# Patient Record
Sex: Female | Born: 1970 | Race: White | Hispanic: No | Smoking: Never smoker
Health system: Southern US, Community
[De-identification: ages and names within clinical notes are randomized; demographics above are authoritative.]

## PROBLEM LIST (undated history)

## (undated) DIAGNOSIS — I480 Paroxysmal atrial fibrillation: Secondary | ICD-10-CM

## (undated) DIAGNOSIS — I10 Essential (primary) hypertension: Secondary | ICD-10-CM

## (undated) DIAGNOSIS — R011 Cardiac murmur, unspecified: Secondary | ICD-10-CM

## (undated) DIAGNOSIS — E669 Obesity, unspecified: Secondary | ICD-10-CM

## (undated) DIAGNOSIS — N950 Postmenopausal bleeding: Secondary | ICD-10-CM

## (undated) DIAGNOSIS — M1990 Unspecified osteoarthritis, unspecified site: Secondary | ICD-10-CM

## (undated) DIAGNOSIS — K5190 Ulcerative colitis, unspecified, without complications: Secondary | ICD-10-CM

## (undated) DIAGNOSIS — R7303 Prediabetes: Secondary | ICD-10-CM

## (undated) DIAGNOSIS — N83201 Unspecified ovarian cyst, right side: Secondary | ICD-10-CM

## (undated) DIAGNOSIS — R7309 Other abnormal glucose: Secondary | ICD-10-CM

## (undated) DIAGNOSIS — D649 Anemia, unspecified: Secondary | ICD-10-CM

## (undated) DIAGNOSIS — I4891 Unspecified atrial fibrillation: Secondary | ICD-10-CM

## (undated) DIAGNOSIS — K219 Gastro-esophageal reflux disease without esophagitis: Secondary | ICD-10-CM

## (undated) DIAGNOSIS — F419 Anxiety disorder, unspecified: Secondary | ICD-10-CM

## (undated) DIAGNOSIS — J189 Pneumonia, unspecified organism: Secondary | ICD-10-CM

## (undated) HISTORY — DX: Essential (primary) hypertension: I10

## (undated) HISTORY — DX: Other abnormal glucose: R73.09

## (undated) HISTORY — DX: Anemia, unspecified: D64.9

## (undated) HISTORY — DX: Cardiac murmur, unspecified: R01.1

## (undated) HISTORY — PX: DILATION AND CURETTAGE OF UTERUS: SHX78

## (undated) HISTORY — DX: Ulcerative colitis, unspecified, without complications: K51.90

## (undated) HISTORY — DX: Obesity, unspecified: E66.9

## (undated) HISTORY — PX: OTHER SURGICAL HISTORY: SHX169

---

## 1993-05-19 HISTORY — PX: TUBAL LIGATION: SHX77

## 1999-09-18 HISTORY — PX: SIGMOIDOSCOPY: SUR1295

## 2012-04-07 ENCOUNTER — Emergency Department: Payer: Self-pay | Admitting: Emergency Medicine

## 2012-04-07 LAB — CBC
HCT: 37.3 % (ref 35.0–47.0)
MCH: 26 pg (ref 26.0–34.0)
MCHC: 32.6 g/dL (ref 32.0–36.0)
MCV: 80 fL (ref 80–100)
Platelet: 333 10*3/uL (ref 150–440)
RDW: 14.7 % — ABNORMAL HIGH (ref 11.5–14.5)
WBC: 10.9 10*3/uL (ref 3.6–11.0)

## 2012-04-07 LAB — BASIC METABOLIC PANEL
BUN: 11 mg/dL (ref 7–18)
Calcium, Total: 9.6 mg/dL (ref 8.5–10.1)
Creatinine: 0.83 mg/dL (ref 0.60–1.30)
EGFR (African American): >60
EGFR (Non-African Amer.): >60
Glucose: 96 mg/dL (ref 65–99)
Potassium: 3.7 mmol/L (ref 3.5–5.1)
Sodium: 135 mmol/L — ABNORMAL LOW (ref 136–145)

## 2012-04-07 LAB — CK TOTAL AND CKMB (NOT AT ARMC)
CK, Total: 55 U/L (ref 21–215)
CK-MB: <0.5 ng/mL — ABNORMAL LOW (ref 0.5–3.6)

## 2012-09-23 ENCOUNTER — Ambulatory Visit: Payer: Self-pay | Admitting: Unknown Physician Specialty

## 2012-09-23 HISTORY — PX: COLONOSCOPY: SHX174

## 2012-09-23 LAB — CLOSTRIDIUM DIFFICILE BY PCR

## 2012-09-27 LAB — PATHOLOGY REPORT

## 2013-02-02 ENCOUNTER — Encounter: Payer: Self-pay | Admitting: *Deleted

## 2013-02-03 ENCOUNTER — Encounter: Payer: Self-pay | Admitting: Cardiovascular Disease

## 2013-02-03 ENCOUNTER — Ambulatory Visit (INDEPENDENT_AMBULATORY_CARE_PROVIDER_SITE_OTHER): Payer: 59 | Admitting: Cardiovascular Disease

## 2013-02-03 VITALS — BP 145/89 | HR 83 | Ht 65.0 in | Wt 227.5 lb

## 2013-02-03 DIAGNOSIS — I10 Essential (primary) hypertension: Secondary | ICD-10-CM

## 2013-02-03 DIAGNOSIS — R011 Cardiac murmur, unspecified: Secondary | ICD-10-CM | POA: Insufficient documentation

## 2013-02-03 NOTE — Progress Notes (Signed)
Primary care physician: Dr. Nira Conn  HPI  This is a pleasant 42 year old female who was referred for evaluation of a cardiac murmur. The patient is not aware of any previous cardiac history. There is no history of rheumatic fever or congenital heart disease. She has chronic medical conditions that include hypertension, ulcerative colitis,  prediabetes and obesity. She was diagnosed with hypertension about a year ago and was started on medications over the last 6 months. Blood pressure is usually much higher at the physician's office. There is no history of sleep apnea and does not have symptoms suggestive of this. She is no longer using NSAIDs. There is family history of hypertension but not at a young age.  She was told of a cardiac murmur recently. She denies any chest pain, dyspnea or any other cardiac symptoms.  Allergies  Allergen Reactions  . Hydrochlorothiazide      Current Outpatient Prescriptions on File Prior to Visit  Medication Sig Dispense Refill  . amLODipine (NORVASC) 5 MG tablet Take 5 mg by mouth daily.      Marland Kitchen lisinopril (PRINIVIL,ZESTRIL) 40 MG tablet Take 40 mg by mouth daily.      . mesalamine (APRISO) 0.375 G 24 hr capsule Take 375 mg by mouth 2 (two) times daily.       No current facility-administered medications on file prior to visit.     Past Medical History  Diagnosis Date  . Obesity, unspecified   . Other abnormal glucose   . Ulcerative colitis, unspecified   . Heart murmur   . Unspecified essential hypertension      Past Surgical History  Procedure Laterality Date  . Tubal ligation    . Dilation and curettage of uterus       Family History  Problem Relation Age of Onset  . Hypertension Father      History   Social History  . Marital Status: Married    Spouse Name: N/A    Number of Children: N/A  . Years of Education: N/A   Occupational History  . Not on file.   Social History Main Topics  . Smoking status: Never Smoker     . Smokeless tobacco: Not on file  . Alcohol Use: No  . Drug Use: No  . Sexual Activity: Not on file   Other Topics Concern  . Not on file   Social History Narrative  . No narrative on file     ROS A 10 point review of system was performed. It's negative other than what is mentioned in the history of present illness.  PHYSICAL EXAM   BP 145/89  Pulse 83  Ht 5\' 5"  (1.651 m)  Wt 227 lb 8 oz (103.193 kg)  BMI 37.86 kg/m2 Constitutional: She is oriented to person, place, and time. She appears well-developed and well-nourished. No distress.  HENT: No nasal discharge.  Head: Normocephalic and atraumatic.  Eyes: Pupils are equal and round. Right eye exhibits no discharge. Left eye exhibits no discharge.  Neck: Normal range of motion. Neck supple. No JVD present. No thyromegaly present.  Cardiovascular: Normal rate, regular rhythm, normal heart sounds. Exam reveals no gallop and no friction rub. There is a 1/6 systolic ejection murmur at the base. Pulmonary/Chest: Effort normal and breath sounds normal. No stridor. No respiratory distress. She has no wheezes. She has no rales. She exhibits no tenderness.  Abdominal: Soft. Bowel sounds are normal. She exhibits no distension. There is no tenderness. There is no rebound and  no guarding.  Musculoskeletal: Normal range of motion. She exhibits no edema and no tenderness.  Neurological: She is alert and oriented to person, place, and time. Coordination normal.  Skin: Skin is warm and dry. No rash noted. She is not diaphoretic. No erythema. No pallor.  Psychiatric: She has a normal mood and affect. Her behavior is normal. Judgment and thought content normal.     JXB:JYNWG  Rhythm  WITHIN NORMAL LIMITS   ASSESSMENT AND PLAN

## 2013-02-03 NOTE — Patient Instructions (Addendum)
Your physician has requested that you have an echocardiogram. Echocardiography is a painless test that uses sound waves to create images of your heart. It provides your doctor with information about the size and shape of your heart and how well your heart's chambers and valves are working. This procedure takes approximately one hour. There are no restrictions for this procedure.  Your physician has requested that you have a renal artery duplex. During this test, an ultrasound is used to evaluate blood flow to the kidneys. Allow one hour for this exam. Do not eat after midnight the day before and avoid carbonated beverages. Take your medications as you usually do.   Follow up as needed.

## 2013-02-03 NOTE — Assessment & Plan Note (Signed)
Most likely this is a functional murmur. However, given her history of hypertension, I will obtain an echocardiogram to evaluate this.

## 2013-02-03 NOTE — Assessment & Plan Note (Signed)
It is somewhat unusual to have this degree of hypertension and her age group without strong family history of hypertension. Thus, I think it's reasonable to evaluate her for secondary hypertension. I don't have her previous labs available for review. It is reasonable to check Aldosterone/Renin ratio and thyroid function if not already done. She has no symptoms suggestive of sleep apnea. I will obtain renal artery duplex ultrasound to evaluate for possible fibromuscular dysplasia.

## 2013-02-18 ENCOUNTER — Other Ambulatory Visit: Payer: Self-pay

## 2013-02-18 ENCOUNTER — Other Ambulatory Visit (INDEPENDENT_AMBULATORY_CARE_PROVIDER_SITE_OTHER): Payer: 59

## 2013-02-18 DIAGNOSIS — I369 Nonrheumatic tricuspid valve disorder, unspecified: Secondary | ICD-10-CM

## 2013-02-18 DIAGNOSIS — R011 Cardiac murmur, unspecified: Secondary | ICD-10-CM

## 2013-02-25 ENCOUNTER — Telehealth: Payer: Self-pay | Admitting: *Deleted

## 2013-02-25 NOTE — Telephone Encounter (Signed)
Message copied by Marilynne Halsted on Fri Feb 25, 2013  3:20 PM ------      Message from: Lorine Bears A      Created: Fri Feb 25, 2013  3:04 PM       Inform patient that echo was fine. Normal heart function. 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       CC: Dr. Lahoma Rocker ------

## 2013-02-25 NOTE — Telephone Encounter (Signed)
Patient called wanting echo results. Thanks

## 2013-02-25 NOTE — Telephone Encounter (Signed)
Spoke w/ pt.  She is aware of results. Will call again if any questions or concerns.

## 2013-03-24 ENCOUNTER — Encounter (INDEPENDENT_AMBULATORY_CARE_PROVIDER_SITE_OTHER): Payer: 59

## 2013-03-24 DIAGNOSIS — I10 Essential (primary) hypertension: Secondary | ICD-10-CM

## 2013-03-28 ENCOUNTER — Telehealth: Payer: Self-pay

## 2013-03-28 NOTE — Telephone Encounter (Signed)
Message copied by Marilynne Halsted on Mon Mar 28, 2013  5:27 PM ------      Message from: Lorine Bears A      Created: Sun Mar 27, 2013 12:44 PM       No evidence of renal artery stenosis. ------

## 2013-03-28 NOTE — Telephone Encounter (Signed)
Spoke w/ pt.  She is aware of results.  

## 2014-01-04 ENCOUNTER — Emergency Department: Payer: Self-pay | Admitting: Internal Medicine

## 2014-01-04 LAB — CBC
HCT: 38.5 % (ref 35.0–47.0)
HGB: 12.8 g/dL (ref 12.0–16.0)
MCH: 27.3 pg (ref 26.0–34.0)
MCHC: 33.2 g/dL (ref 32.0–36.0)
MCV: 82 fL (ref 80–100)
PLATELETS: 325 10*3/uL (ref 150–440)
RBC: 4.7 10*6/uL (ref 3.80–5.20)
RDW: 14.6 % — AB (ref 11.5–14.5)
WBC: 7 10*3/uL (ref 3.6–11.0)

## 2014-01-04 LAB — BASIC METABOLIC PANEL
Anion Gap: 8 (ref 7–16)
BUN: 10 mg/dL (ref 7–18)
CALCIUM: 9 mg/dL (ref 8.5–10.1)
CO2: 27 mmol/L (ref 21–32)
Chloride: 105 mmol/L (ref 98–107)
Creatinine: 0.93 mg/dL (ref 0.60–1.30)
Glucose: 90 mg/dL (ref 65–99)
OSMOLALITY: 278 (ref 275–301)
Potassium: 3.9 mmol/L (ref 3.5–5.1)
Sodium: 140 mmol/L (ref 136–145)

## 2014-01-04 LAB — TROPONIN I

## 2014-01-04 LAB — D-DIMER(ARMC): D-DIMER: 318 ng/mL

## 2014-01-04 LAB — TSH: Thyroid Stimulating Horm: 1.12 u[IU]/mL

## 2014-01-05 ENCOUNTER — Telehealth: Payer: Self-pay

## 2014-01-05 DIAGNOSIS — Z01812 Encounter for preprocedural laboratory examination: Secondary | ICD-10-CM

## 2014-01-05 DIAGNOSIS — R0789 Other chest pain: Secondary | ICD-10-CM

## 2014-01-05 DIAGNOSIS — I739 Peripheral vascular disease, unspecified: Secondary | ICD-10-CM

## 2014-01-05 NOTE — Telephone Encounter (Signed)
Pt called, states she was in ED for chest heaviness, states that everything checked out ok, but not sure if Dr. Kirke CorinArida wanted to see her. Please call and advise

## 2014-01-05 NOTE — Telephone Encounter (Signed)
Patient was in the ED for chest heaviness  She stated the ED doc wanted her to ask about a stress test  She was sent home the dame day with an RX for Ativan  She wants to know if Dr. Kirke CorinArida wants to see her

## 2014-01-05 NOTE — Telephone Encounter (Signed)
Schedule a treadmill stress test and follow up.

## 2014-01-06 NOTE — Telephone Encounter (Signed)
Just to clarify  You want GXT not Myoview correct?

## 2014-01-06 NOTE — Telephone Encounter (Signed)
Yes, GXT.

## 2014-01-09 ENCOUNTER — Encounter: Payer: Self-pay | Admitting: *Deleted

## 2014-01-09 NOTE — Telephone Encounter (Signed)
Patient scheduled for GXT  Patient aware

## 2014-01-09 NOTE — Telephone Encounter (Signed)
LVM 8/24 

## 2014-01-26 ENCOUNTER — Ambulatory Visit (INDEPENDENT_AMBULATORY_CARE_PROVIDER_SITE_OTHER): Payer: 59 | Admitting: Cardiovascular Disease

## 2014-01-26 ENCOUNTER — Encounter: Payer: Self-pay | Admitting: Cardiovascular Disease

## 2014-01-26 VITALS — BP 125/60 | HR 88 | Ht 65.0 in | Wt 229.8 lb

## 2014-01-26 DIAGNOSIS — R0789 Other chest pain: Secondary | ICD-10-CM

## 2014-01-26 NOTE — Patient Instructions (Signed)
Normal stress test

## 2014-01-26 NOTE — Procedures (Signed)
   Treadmill Stress test  Indication: Chest pain  Baseline Data:  Resting EKG shows NSR with rate of 88 bpm, no significant ST or T wave changes. Resting blood pressure of 125/66 mm Hg Stand bruce protocal was used.  Exercise Data:  Patient exercised for 6 min 0 sec,  Peak heart rate of 153 bpm.  This was 87 % of the maximum predicted heart rate. No symptoms of chest pain or lightheadedness were reported at peak stress or in recovery.  Peak Blood pressure recorded was 164/69 Maximal work level: 7 METs.  Heart rate at 3 minutes in recovery was 110 bpm. BP response: Normal HR response: Normal  EKG with Exercise: Sinus tachycardia with no significant ST changes.  FINAL IMPRESSION: Normal exercise stress test. No significant EKG changes concerning for ischemia. Average exercise tolerance.  Recommendation: The chest pain is likely noncardiac and could be related to anxiety.

## 2014-01-31 ENCOUNTER — Other Ambulatory Visit: Payer: Self-pay | Admitting: *Deleted

## 2015-07-20 ENCOUNTER — Other Ambulatory Visit: Payer: Self-pay | Admitting: Internal Medicine

## 2015-07-20 DIAGNOSIS — R102 Pelvic and perineal pain: Secondary | ICD-10-CM

## 2015-07-23 ENCOUNTER — Other Ambulatory Visit: Payer: Self-pay | Admitting: Internal Medicine

## 2015-07-23 DIAGNOSIS — R102 Pelvic and perineal pain: Secondary | ICD-10-CM

## 2015-07-30 ENCOUNTER — Ambulatory Visit
Admission: RE | Admit: 2015-07-30 | Discharge: 2015-07-30 | Disposition: A | Discharge status: Home / Self Care | Payer: Self-pay | Source: Ambulatory Visit | Attending: Internal Medicine | Admitting: Internal Medicine

## 2015-08-06 ENCOUNTER — Ambulatory Visit
Admission: RE | Admit: 2015-08-06 | Discharge: 2015-08-06 | Disposition: A | Discharge status: Home / Self Care | Payer: 59 | Source: Ambulatory Visit | Attending: Internal Medicine | Admitting: Internal Medicine

## 2015-08-06 DIAGNOSIS — R102 Pelvic and perineal pain: Secondary | ICD-10-CM | POA: Diagnosis not present

## 2015-08-06 DIAGNOSIS — N858 Other specified noninflammatory disorders of uterus: Secondary | ICD-10-CM | POA: Insufficient documentation

## 2016-09-01 DIAGNOSIS — I10 Essential (primary) hypertension: Secondary | ICD-10-CM | POA: Diagnosis not present

## 2016-09-01 DIAGNOSIS — M7661 Achilles tendinitis, right leg: Secondary | ICD-10-CM | POA: Diagnosis not present

## 2016-09-01 DIAGNOSIS — J3089 Other allergic rhinitis: Secondary | ICD-10-CM | POA: Diagnosis not present

## 2017-02-13 DIAGNOSIS — M25562 Pain in left knee: Secondary | ICD-10-CM | POA: Diagnosis not present

## 2017-02-13 DIAGNOSIS — I10 Essential (primary) hypertension: Secondary | ICD-10-CM | POA: Diagnosis not present

## 2017-02-24 DIAGNOSIS — I10 Essential (primary) hypertension: Secondary | ICD-10-CM | POA: Diagnosis not present

## 2017-03-11 DIAGNOSIS — Z0000 Encounter for general adult medical examination without abnormal findings: Secondary | ICD-10-CM | POA: Diagnosis not present

## 2017-03-11 DIAGNOSIS — R7303 Prediabetes: Secondary | ICD-10-CM | POA: Diagnosis not present

## 2017-03-27 DIAGNOSIS — J208 Acute bronchitis due to other specified organisms: Secondary | ICD-10-CM | POA: Diagnosis not present

## 2017-03-27 DIAGNOSIS — B9689 Other specified bacterial agents as the cause of diseases classified elsewhere: Secondary | ICD-10-CM | POA: Diagnosis not present

## 2018-07-14 ENCOUNTER — Other Ambulatory Visit: Payer: Self-pay | Admitting: Internal Medicine

## 2018-07-14 DIAGNOSIS — M545 Low back pain: Secondary | ICD-10-CM | POA: Diagnosis not present

## 2018-07-14 DIAGNOSIS — Z23 Encounter for immunization: Secondary | ICD-10-CM | POA: Diagnosis not present

## 2018-07-14 DIAGNOSIS — Z0000 Encounter for general adult medical examination without abnormal findings: Secondary | ICD-10-CM | POA: Diagnosis not present

## 2018-07-14 DIAGNOSIS — M5416 Radiculopathy, lumbar region: Secondary | ICD-10-CM | POA: Diagnosis not present

## 2018-07-14 DIAGNOSIS — I10 Essential (primary) hypertension: Secondary | ICD-10-CM | POA: Diagnosis not present

## 2018-07-14 DIAGNOSIS — R7303 Prediabetes: Secondary | ICD-10-CM | POA: Diagnosis not present

## 2018-07-14 DIAGNOSIS — Z1231 Encounter for screening mammogram for malignant neoplasm of breast: Secondary | ICD-10-CM

## 2019-02-16 ENCOUNTER — Emergency Department
Admission: EM | Admit: 2019-02-16 | Discharge: 2019-02-16 | Disposition: A | Discharge status: Home / Self Care | Payer: 59 | Attending: Emergency Medicine | Admitting: Emergency Medicine

## 2019-02-16 ENCOUNTER — Encounter: Payer: Self-pay | Admitting: Emergency Medicine

## 2019-02-16 ENCOUNTER — Other Ambulatory Visit: Payer: Self-pay

## 2019-02-16 DIAGNOSIS — I10 Essential (primary) hypertension: Secondary | ICD-10-CM | POA: Diagnosis not present

## 2019-02-16 DIAGNOSIS — Z79899 Other long term (current) drug therapy: Secondary | ICD-10-CM | POA: Diagnosis not present

## 2019-02-16 DIAGNOSIS — I4891 Unspecified atrial fibrillation: Secondary | ICD-10-CM | POA: Diagnosis not present

## 2019-02-16 DIAGNOSIS — R000 Tachycardia, unspecified: Secondary | ICD-10-CM | POA: Diagnosis present

## 2019-02-16 HISTORY — DX: Unspecified atrial fibrillation: I48.91

## 2019-02-16 LAB — COMPREHENSIVE METABOLIC PANEL
ALT: 13 U/L (ref 0–44)
AST: 18 U/L (ref 15–41)
Albumin: 4.1 g/dL (ref 3.5–5.0)
Alkaline Phosphatase: 55 U/L (ref 38–126)
Anion gap: 11 (ref 5–15)
BUN: 22 mg/dL — ABNORMAL HIGH (ref 6–20)
CO2: 23 mmol/L (ref 22–32)
Calcium: 9.4 mg/dL (ref 8.9–10.3)
Chloride: 101 mmol/L (ref 98–111)
Creatinine, Ser: 0.88 mg/dL (ref 0.44–1.00)
GFR calc Af Amer: >60 mL/min (ref >60)
GFR calc non Af Amer: >60 mL/min (ref >60)
Glucose, Bld: 105 mg/dL — ABNORMAL HIGH (ref 70–99)
Potassium: 3.8 mmol/L (ref 3.5–5.1)
Sodium: 135 mmol/L (ref 135–145)
Total Bilirubin: 0.8 mg/dL (ref 0.3–1.2)
Total Protein: 8.2 g/dL — ABNORMAL HIGH (ref 6.5–8.1)

## 2019-02-16 LAB — CBC
HCT: 37 % (ref 36.0–46.0)
Hemoglobin: 12.1 g/dL (ref 12.0–15.0)
MCH: 27 pg (ref 26.0–34.0)
MCHC: 32.7 g/dL (ref 30.0–36.0)
MCV: 82.6 fL (ref 80.0–100.0)
Platelets: 343 10*3/uL (ref 150–400)
RBC: 4.48 MIL/uL (ref 3.87–5.11)
RDW: 14.6 % (ref 11.5–15.5)
WBC: 8.3 10*3/uL (ref 4.0–10.5)
nRBC: 0 % (ref 0.0–0.2)

## 2019-02-16 LAB — TROPONIN I (HIGH SENSITIVITY): Troponin I (High Sensitivity): 3 ng/L (ref <18)

## 2019-02-16 MED ORDER — DILTIAZEM HCL ER COATED BEADS 120 MG PO CP24
120.0000 mg | ORAL_CAPSULE | Freq: Once | ORAL | Status: AC
  Administered 2019-02-16: 13:00:00 120 mg via ORAL
  Filled 2019-02-16: qty 1

## 2019-02-16 MED ORDER — DILTIAZEM HCL ER COATED BEADS 120 MG PO CP24: 120.0000 mg | ORAL_CAPSULE | Freq: Every day | ORAL | 0 refills | Status: DC

## 2019-02-16 MED ORDER — DILTIAZEM HCL 25 MG/5ML IV SOLN
10.0000 mg | Freq: Once | INTRAVENOUS | Status: AC
  Administered 2019-02-16: 10:00:00 10 mg via INTRAVENOUS
  Filled 2019-02-16: qty 5

## 2019-02-16 MED ORDER — SODIUM CHLORIDE 0.9 % IV BOLUS
1000.0000 mL | Freq: Once | INTRAVENOUS | Status: AC
  Administered 2019-02-16: 10:00:00 1000 mL via INTRAVENOUS

## 2019-02-16 NOTE — ED Provider Notes (Signed)
[facility] Emergency Department Provider Note  Time seen: 10:15 AM  I have reviewed the triage vital signs and the nursing notes.   HISTORY  Chief Complaint Atrial Fibrillation   HPI Rebecca Compton is a 48 y.o. female with no significant past medical history presents to the emergency department for an irregular heartbeat.  According to the patient she woke this morning feeling her heart jumping and racing at times.  Denies any history of the same.  Denies any alcohol use.  Denies any recent illnesses fever cough or shortness of breath.  Denies any nausea vomiting or diarrhea.  Past Medical History:  Diagnosis Date  . Heart murmur   . Obesity, unspecified   . Other abnormal glucose   . Ulcerative colitis, unspecified   . Unspecified essential hypertension     Patient Active Problem List   Diagnosis Date Noted  . Heart murmur 02/03/2013  . Unspecified essential hypertension     Past Surgical History:  Procedure Laterality Date  . DILATION AND CURETTAGE OF UTERUS    . TUBAL LIGATION      Prior to Admission medications   Medication Sig Start Date End Date Taking? Authorizing Provider  acetaminophen (TYLENOL) 500 MG tablet Take by mouth every 6 (six) hours as needed for pain.    [provider]  amLODipine (NORVASC) 10 MG tablet Take 10 mg by mouth daily.    [provider]  lisinopril (PRINIVIL,ZESTRIL) 40 MG tablet Take 40 mg by mouth daily.    [provider]  mesalamine (APRISO) 0.375 G 24 hr capsule Take 375 mg by mouth 2 (two) times daily.    [provider]    Allergies  Allergen Reactions  . Hydrochlorothiazide Other (See Comments)    Chest Pain    Family History  Problem Relation Age of Onset  . Hypertension Father     Social History Social History   Tobacco Use  . Smoking status: Never Smoker  . Smokeless tobacco: Never Used  Substance Use Topics  . Alcohol use: No  . Drug use: No    Review  of Systems Constitutional: Negative for fever. Cardiovascular: Negative for chest pain.  Positive for palpitations. Respiratory: Negative for shortness of breath. Gastrointestinal: Negative for abdominal pain, vomiting and diarrhea. Genitourinary: Negative for urinary compaints Musculoskeletal: Negative for musculoskeletal complaints Neurological: Negative for headache All other ROS negative  ____________________________________________   PHYSICAL EXAM:  VITAL SIGNS: ED Triage Vitals  Enc Vitals Group     BP 02/16/19 0949 (!) 154/67     Pulse Rate 02/16/19 0949 80     Resp 02/16/19 0949 18     Temp 02/16/19 0949 98 F (36.7 C)     Temp Source 02/16/19 0949 Oral     SpO2 02/16/19 0949 100 %     Weight 02/16/19 0957 260 lb (117.9 kg)     Height 02/16/19 0957 5\' 5"  (1.651 m)     Head Circumference --      Peak Flow --      Pain Score 02/16/19 0956 0     Pain Loc --      Pain Edu? --      Excl. in [place]? --    Constitutional: Alert and oriented. Well appearing and in no distress. Eyes: Normal exam ENT      Head: Normocephalic and atraumatic.      Mouth/Throat: Mucous membranes are moist. Cardiovascular: Irregular rhythm rate around 100 120 bpm.  No  obvious murmur. Respiratory: Normal respiratory effort without tachypnea nor retractions. Breath sounds are clear  Gastrointestinal: Soft and nontender. No distention.  Musculoskeletal: Nontender with normal range of motion in all extremities.  No lower extremity edema or tenderness. Neurologic:  Normal speech and language. No gross focal neurologic deficits  Skin:  Skin is warm, dry and intact.  Psychiatric: Mood and affect are normal.   ____________________________________________    EKG  EKG viewed and interpreted by myself shows atrial fibrillation with rapid ventricular response at 142 bpm with a narrow QRS, normal axis, normal intervals, no concerning ST  changes.  ____________________________________________   INITIAL IMPRESSION / ASSESSMENT AND PLAN / ED COURSE  Pertinent labs & imaging results that were available during my care of the patient were reviewed by me and considered in my medical decision making (see chart for details).   Patient presents to the emergency department for an irregular and fast heart rate.  Patient appears to be in atrial fibrillation with rapid ventricular response on EKG.  During my evaluation patient's heart rate would vary between 101 130 bpm.  No history of A. fib in the past.  We will check labs, dose 10 mg of diltiazem and a liter of IV fluids and continue to closely monitor.  Patient agreeable to plan of care.  Patient's work-up is largely within normal limits including a negative troponin.  Patient remains in atrial fibrillation however appears to be much better rate controlled between 85 and 95 bpm.  We will discuss with cardiology to arrange for follow-up and to discuss anticoagulation.  I spoke with Dr. Ubaldo Glassing of cardiology regarding the patient.  Recommend starting baby aspirin 81 mg daily as well as Cardizem 120 ER.  He states he will see the patient tomorrow in the office.  Patient agreeable plan of care.  Remained rate controlled around 90 bpm with no symptoms.  Rebecca Compton was evaluated in Emergency Department on 02/16/2019 for the symptoms described in the history of present illness. She was evaluated in the context of the global COVID-19 pandemic, which necessitated consideration that the patient might be at risk for infection with the SARS-CoV-2 virus that causes COVID-19. Institutional protocols and algorithms that pertain to the evaluation of patients at risk for COVID-19 are in a state of rapid change based on information released by regulatory bodies including the CDC and federal and state organizations. These policies and algorithms were followed during the patient's care in the ED.  CRITICAL  CARE Performed by: Harvest Dark   Total critical care time: 30 minutes  Critical care time was exclusive of separately billable procedures and treating other patients.  Critical care was necessary to treat or prevent imminent or life-threatening deterioration.  Critical care was time spent personally by me on the following activities: development of treatment plan with patient and/or surrogate as well as nursing, discussions with consultants, evaluation of patient's response to treatment, examination of patient, obtaining history from patient or surrogate, ordering and performing treatments and interventions, ordering and review of laboratory studies, ordering and review of radiographic studies, pulse oximetry and re-evaluation of patient's condition.   ____________________________________________   FINAL CLINICAL IMPRESSION(S) / ED DIAGNOSES  New onset atrial fibrillation with rapid ventricular response   Harvest Dark, MD 02/16/19 1158

## 2019-02-16 NOTE — ED Triage Notes (Signed)
Pt in via POV, reports waking up this morning with irregular heart beat, feeling palpitations.  Pt denies hx of same.  EKG shows Afibb w/ RVR, rate 142.  Ambulatory to triage, denies any associated symptoms.  NAD noted at this time.

## 2019-02-23 ENCOUNTER — Encounter: Payer: Self-pay | Admitting: Emergency Medicine

## 2019-02-23 ENCOUNTER — Other Ambulatory Visit: Payer: Self-pay

## 2019-02-23 ENCOUNTER — Emergency Department
Admission: EM | Admit: 2019-02-23 | Discharge: 2019-02-23 | Disposition: A | Discharge status: Home / Self Care | Payer: 59 | Attending: Emergency Medicine | Admitting: Emergency Medicine

## 2019-02-23 DIAGNOSIS — R000 Tachycardia, unspecified: Secondary | ICD-10-CM | POA: Diagnosis present

## 2019-02-23 DIAGNOSIS — R002 Palpitations: Secondary | ICD-10-CM | POA: Diagnosis not present

## 2019-02-23 DIAGNOSIS — Z79899 Other long term (current) drug therapy: Secondary | ICD-10-CM | POA: Diagnosis not present

## 2019-02-23 DIAGNOSIS — I480 Paroxysmal atrial fibrillation: Secondary | ICD-10-CM | POA: Diagnosis not present

## 2019-02-23 HISTORY — DX: Unspecified atrial fibrillation: I48.91

## 2019-02-23 MED ORDER — DILTIAZEM HCL ER COATED BEADS 120 MG PO CP24
120.0000 mg | ORAL_CAPSULE | Freq: Once | ORAL | Status: AC
  Administered 2019-02-23: 120 mg via ORAL
  Filled 2019-02-23: qty 1

## 2019-02-23 MED ORDER — DILTIAZEM HCL 30 MG PO TABS: 30.0000 mg | ORAL_TABLET | Freq: Every day | ORAL | 0 refills | Status: AC | PRN

## 2019-02-23 NOTE — ED Triage Notes (Signed)
Says recent dx a fib.  Says it started again last night about 2am and just went away.  No chest pain.

## 2019-02-23 NOTE — ED Provider Notes (Signed)
[facility] Emergency Department Provider Note  ____________________________________________  Time seen: Approximately 8:17 AM  I have reviewed the triage vital signs and the nursing notes.   HISTORY  Chief Complaint Irregular Heart Beat    HPI Rebecca Compton is a 48 y.o. female with a history of atrial fibrillation, ulcerative colitis, hypertension who comes the ED complaining of atrial fibrillation and fast heart rate.  Denies chest pain or shortness of breath, just noted palpitations and racing heartbeat that started about 2 AM last night.  Symptoms were constant, now resolved, no aggravating or alleviating factors.  She was recently diagnosed with A. fib, evaluated in hospital, discharged home on diltiazem 120 mg extended release which she has been taking every morning.  She has not had at this morning due to coming instead to the emergency department.  No other new medications or dietary supplements.  She has been restricting her diet lately and attempts to lose weight.  Denies excessive caffeine use.  Onset was not during exertion.      Past Medical History:  Diagnosis Date  . Atrial fibrillation (HCC)   . Heart murmur   . Obesity, unspecified   . Other abnormal glucose   . Ulcerative colitis, unspecified   . Unspecified essential hypertension      Patient Active Problem List   Diagnosis Date Noted  . Heart murmur 02/03/2013  . Unspecified essential hypertension      Past Surgical History:  Procedure Laterality Date  . DILATION AND CURETTAGE OF UTERUS    . TUBAL LIGATION       Prior to Admission medications   Medication Sig Start Date End Date Taking? Authorizing Provider  acetaminophen (TYLENOL) 500 MG tablet Take 500-1,000 mg by mouth every 6 (six) hours as needed for mild pain or fever.     [provider]  amLODipine (NORVASC) 10 MG tablet Take 10 mg by mouth daily.    [provider]  chlorthalidone (HYGROTON) 25 MG  tablet Take 25 mg by mouth daily.    [provider]  diltiazem (CARDIZEM CD) 120 MG 24 hr capsule Take 1 capsule (120 mg total) by mouth daily. 02/16/19 02/16/20  Minna Antis, MD  diltiazem (CARDIZEM) 30 MG tablet Take 1 tablet (30 mg total) by mouth daily as needed (Take as needed for atrial fibrillation and heart rate above 110 that resists your long-acting medicine). 02/23/19 03/25/19  Sharman Cheek, MD  lisinopril (PRINIVIL,ZESTRIL) 40 MG tablet Take 40 mg by mouth daily.    [provider]  meloxicam (MOBIC) 15 MG tablet Take 15 mg by mouth daily as needed for pain. 12/29/18   [provider]     Allergies Hydrochlorothiazide   Family History  Problem Relation Age of Onset  . Hypertension Father     Social History Social History   Tobacco Use  . Smoking status: Never Smoker  . Smokeless tobacco: Never Used  Substance Use Topics  . Alcohol use: No  . Drug use: No    Review of Systems  Constitutional:   No fever or chills.  ENT:   No sore throat. No rhinorrhea. Cardiovascular:   No chest pain or syncope.  Positive palpitations Respiratory:   No dyspnea or cough. Gastrointestinal:   Negative for abdominal pain, vomiting and diarrhea.  Musculoskeletal:   Negative for focal pain or swelling All other systems reviewed and are negative except as documented above in ROS and HPI.  ____________________________________________   PHYSICAL EXAM:  VITAL SIGNS: ED Triage Vitals  Enc Vitals Group     BP 02/23/19 0744 122/81     Pulse Rate 02/23/19 0744 85     Resp 02/23/19 0744 20     Temp 02/23/19 0744 98.2 F (36.8 C)     Temp Source 02/23/19 0744 Oral     SpO2 02/23/19 0744 100 %     Weight 02/23/19 0742 259 lb 14.8 oz (117.9 kg)     Height 02/23/19 0742 5\' 5"  (1.651 m)     Head Circumference --      Peak Flow --      Pain Score 02/23/19 0742 0     Pain Loc --      Pain Edu? --      Excl. in [place]? --     Vital signs reviewed,  nursing assessments reviewed.   Constitutional:   Alert and oriented. Non-toxic appearance. Eyes:   Conjunctivae are normal. EOMI. PERRL. ENT      Head:   Normocephalic and atraumatic.      Nose:   Wearing a mask.      Mouth/Throat:   Wearing a mask.      Neck:   No meningismus. Full ROM. Hematological/Lymphatic/Immunilogical:   No cervical lymphadenopathy. Cardiovascular:   RRR. Symmetric bilateral radial and DP pulses.  No murmurs. Cap refill less than 2 seconds. Respiratory:   Normal respiratory effort without tachypnea/retractions. Breath sounds are clear and equal bilaterally. No wheezes/rales/rhonchi. Gastrointestinal:   Soft and nontender. Non distended. There is no CVA tenderness.  No rebound, rigidity, or guarding.  Musculoskeletal:   Normal range of motion in all extremities. No joint effusions.  No lower extremity tenderness.  No edema. Neurologic:   Normal speech and language.  Motor grossly intact. No acute focal neurologic deficits are appreciated.  Skin:    Skin is warm, dry and intact. No rash noted.  No petechiae, purpura, or bullae.  ____________________________________________    LABS (pertinent positives/negatives) (all labs ordered are listed, but only abnormal results are displayed) Labs Reviewed - No data to display ____________________________________________   EKG  Interpreted by me  Date: 02/23/2019  Rate: 81  Rhythm: normal sinus rhythm  QRS Axis: normal  Intervals: normal  ST/T Wave abnormalities: normal  Conduction Disutrbances: none  Narrative Interpretation: unremarkable      ____________________________________________    RADIOLOGY  No results found.  ____________________________________________   PROCEDURES Procedures  ____________________________________________    CLINICAL IMPRESSION / ASSESSMENT AND PLAN / ED COURSE  Medications ordered in the ED: Medications  diltiazem (CARDIZEM CD) 24 hr capsule 120 mg (120 mg  Oral Given 02/23/19 0809)    Pertinent labs & imaging results that were available during my care of the patient were reviewed by me and considered in my medical decision making (see chart for details).  Rebecca Compton was evaluated in Emergency Department on 02/23/2019 for the symptoms described in the history of present illness. She was evaluated in the context of the global COVID-19 pandemic, which necessitated consideration that the patient might be at risk for infection with the SARS-CoV-2 virus that causes COVID-19. Institutional protocols and algorithms that pertain to the evaluation of patients at risk for COVID-19 are in a state of rapid change based on information released by regulatory bodies including the CDC and federal and state organizations. These policies and algorithms were followed during the patient's care in the ED.   Patient presents with an episode of palpitations, fast heart rate that  she noted at home consistent with paroxysmal atrial fibrillation.  Now resolved.  No other new symptoms.Considering the patient's symptoms, medical history, and physical examination today, I have low suspicion for ACS, PE, TAD, pneumothorax, carditis, mediastinitis, pneumonia, CHF, or sepsis.  Vital signs are currently normal, she feels asymptomatic, she is in normal sinus rhythm.  I will give her the morning dose of extended release diltiazem.  Prescription for immediate release 30 mg diltiazem to take once daily as needed for recurrent symptoms.      ____________________________________________   FINAL CLINICAL IMPRESSION(S) / ED DIAGNOSES    Final diagnoses:  Paroxysmal atrial fibrillation [facility](HCC)     ED Discharge Orders         Ordered    diltiazem (CARDIZEM) 30 MG tablet  Daily PRN     02/23/19 0817          Portions of this note were generated with dragon dictation software. Dictation errors may occur despite best attempts at proofreading.   Sharman CheekStafford, Jamile Sivils, MD 02/23/19  [phone]

## 2019-09-03 ENCOUNTER — Other Ambulatory Visit: Payer: Self-pay

## 2019-09-03 ENCOUNTER — Emergency Department: Payer: 59

## 2019-09-03 DIAGNOSIS — Z79899 Other long term (current) drug therapy: Secondary | ICD-10-CM | POA: Insufficient documentation

## 2019-09-03 DIAGNOSIS — R0789 Other chest pain: Secondary | ICD-10-CM | POA: Insufficient documentation

## 2019-09-03 DIAGNOSIS — I10 Essential (primary) hypertension: Secondary | ICD-10-CM | POA: Insufficient documentation

## 2019-09-03 NOTE — ED Triage Notes (Signed)
Patient reports chest heaviness.  Patient reports had episode on Thursday and resolved on its own but returned today.

## 2019-09-04 ENCOUNTER — Encounter: Payer: Self-pay | Admitting: Radiology

## 2019-09-04 ENCOUNTER — Emergency Department: Payer: 59

## 2019-09-04 ENCOUNTER — Emergency Department
Admission: EM | Admit: 2019-09-04 | Discharge: 2019-09-04 | Disposition: A | Discharge status: Home / Self Care | Payer: 59 | Attending: Emergency Medicine | Admitting: Emergency Medicine

## 2019-09-04 DIAGNOSIS — R079 Chest pain, unspecified: Secondary | ICD-10-CM

## 2019-09-04 LAB — CBC
HCT: 37.2 % (ref 36.0–46.0)
Hemoglobin: 11.9 g/dL — ABNORMAL LOW (ref 12.0–15.0)
MCH: 26.7 pg (ref 26.0–34.0)
MCHC: 32 g/dL (ref 30.0–36.0)
MCV: 83.6 fL (ref 80.0–100.0)
Platelets: 316 10*3/uL (ref 150–400)
RBC: 4.45 MIL/uL (ref 3.87–5.11)
RDW: 15.4 % (ref 11.5–15.5)
WBC: 10 10*3/uL (ref 4.0–10.5)
nRBC: 0 % (ref 0.0–0.2)

## 2019-09-04 LAB — BASIC METABOLIC PANEL
Anion gap: 10 (ref 5–15)
BUN: 21 mg/dL — ABNORMAL HIGH (ref 6–20)
CO2: 25 mmol/L (ref 22–32)
Calcium: 9.5 mg/dL (ref 8.9–10.3)
Chloride: 101 mmol/L (ref 98–111)
Creatinine, Ser: 0.8 mg/dL (ref 0.44–1.00)
GFR calc Af Amer: >60 mL/min (ref >60)
GFR calc non Af Amer: >60 mL/min (ref >60)
Glucose, Bld: 98 mg/dL (ref 70–99)
Potassium: 3.6 mmol/L (ref 3.5–5.1)
Sodium: 136 mmol/L (ref 135–145)

## 2019-09-04 LAB — TROPONIN I (HIGH SENSITIVITY)
Troponin I (High Sensitivity): <2 ng/L (ref <18)
Troponin I (High Sensitivity): <2 ng/L (ref <18)

## 2019-09-04 MED ORDER — IOHEXOL 350 MG/ML SOLN
75.0000 mL | Freq: Once | INTRAVENOUS | Status: AC | PRN
  Administered 2019-09-04: 75 mL via INTRAVENOUS

## 2019-09-04 MED ORDER — ASPIRIN 81 MG PO CHEW
CHEWABLE_TABLET | ORAL | Status: AC
  Filled 2019-09-04: qty 4

## 2019-09-04 MED ORDER — ASPIRIN 81 MG PO CHEW
324.0000 mg | CHEWABLE_TABLET | Freq: Once | ORAL | Status: AC
  Administered 2019-09-04: 04:00:00 324 mg via ORAL

## 2019-09-04 NOTE — ED Provider Notes (Signed)
[facility] Emergency Department Provider Note _______   First MD Initiated Contact with Patient 09/04/19 0310     (approximate)  I have reviewed the triage vital signs and the nursing notes.   HISTORY  Chief Complaint Chest Pain   HPI Rebecca Compton is a 49 y.o. female with below listed previous medical conditions including atrial fibrillation for which patient takes diltiazem and is not anticoagulated presents to the emergency department secondary to 2 episodes of chest "heaviness" which occurred on Thursday and then subsequently tonight.  Patient denies any cough no fever.  Patient denies any dyspnea.  Patient does admit to lower extremity swelling which improves her chlorthalidone as prescribed.  Patient denies any history of DVT PE or MI.  Of note patient recently had an echocardiogram and stress test performed by Dr. Ubaldo Glassing cardiology last month which was normal       Past Medical History:  Diagnosis Date  . Atrial fibrillation ([place])   . Heart murmur   . Obesity, unspecified   . Other abnormal glucose   . Ulcerative colitis, unspecified   . Unspecified essential hypertension     Patient Active Problem List   Diagnosis Date Noted  . Heart murmur 02/03/2013  . Unspecified essential hypertension     Past Surgical History:  Procedure Laterality Date  . DILATION AND CURETTAGE OF UTERUS    . TUBAL LIGATION      Prior to Admission medications   Medication Sig Start Date End Date Taking? Authorizing Provider  acetaminophen (TYLENOL) 500 MG tablet Take 500-1,000 mg by mouth every 6 (six) hours as needed for mild pain or fever.     [provider]  amLODipine (NORVASC) 10 MG tablet Take 10 mg by mouth daily.    [provider]  chlorthalidone (HYGROTON) 25 MG tablet Take 25 mg by mouth daily.    [provider]  diltiazem (CARDIZEM CD) 120 MG 24 hr capsule Take 1 capsule (120 mg total) by mouth daily. 02/16/19 02/16/20   Harvest Dark, MD  diltiazem (CARDIZEM) 30 MG tablet Take 1 tablet (30 mg total) by mouth daily as needed (Take as needed for atrial fibrillation and heart rate above 110 that resists your long-acting medicine). 02/23/19 03/25/19  Carrie Mew, MD  lisinopril (PRINIVIL,ZESTRIL) 40 MG tablet Take 40 mg by mouth daily.    [provider]  meloxicam (MOBIC) 15 MG tablet Take 15 mg by mouth daily as needed for pain. 12/29/18   [provider]    Allergies Hydrochlorothiazide  Family History  Problem Relation Age of Onset  . Hypertension Father     Social History Social History   Tobacco Use  . Smoking status: Never Smoker  . Smokeless tobacco: Never Used  Substance Use Topics  . Alcohol use: No  . Drug use: No    Review of Systems Constitutional: No fever/chills Eyes: No visual changes. ENT: No sore throat. Cardiovascular: Positive for chest "heaviness" Respiratory: Denies shortness of breath. Gastrointestinal: No abdominal pain.  No nausea, no vomiting.  No diarrhea.  No constipation. Genitourinary: Negative for dysuria. Musculoskeletal: Negative for neck pain.  Negative for back pain. Integumentary: Negative for rash. Neurological: Negative for headaches, focal weakness or numbness.   ____________________________________________   PHYSICAL EXAM:  VITAL SIGNS: ED Triage Vitals  Enc Vitals Group     BP 09/03/19 2333 (!) 144/67     Pulse Rate 09/03/19 2333 77     Resp 09/03/19 2333 18  Temp 09/03/19 2333 98.5 F (36.9 C)     Temp Source 09/03/19 2333 Oral     SpO2 09/03/19 2333 100 %     Weight 09/03/19 2327 112.9 kg (249 lb)     Height 09/03/19 2327 1.651 m (5\' 5" )     Head Circumference --      Peak Flow --      Pain Score 09/03/19 2326 8     Pain Loc --      Pain Edu? --      Excl. in GC? --     Constitutional: Alert and oriented.  Eyes: Conjunctivae are normal.  Mouth/Throat: Patient is wearing a mask. Neck: No stridor.  No  meningeal signs.   Cardiovascular: Normal rate, regular rhythm. Good peripheral circulation. Grossly normal heart sounds. Respiratory: Normal respiratory effort.  No retractions. Gastrointestinal: Soft and nontender. No distention.  Musculoskeletal: No lower extremity tenderness nor edema. No gross deformities of extremities. Neurologic:  Normal speech and language. No gross focal neurologic deficits are appreciated.  Skin:  Skin is warm, dry and intact. Psychiatric: Anxious affect, speech and behavior are normal.  ____________________________________________   LABS (all labs ordered are listed, but only abnormal results are displayed)  Labs Reviewed  BASIC METABOLIC PANEL - Abnormal; Notable for the following components:      Result Value   BUN 21 (*)    All other components within normal limits  CBC - Abnormal; Notable for the following components:   Hemoglobin 11.9 (*)    All other components within normal limits  TROPONIN I (HIGH SENSITIVITY)  TROPONIN I (HIGH SENSITIVITY)   ____________________________________________  EKG  ED ECG REPORT I, [place] N Jalyne Brodzinski, the attending physician, personally viewed and interpreted this ECG.   Date: 09/04/2019  EKG Time: 11:29 PM  Rate: 85  Rhythm: Normal sinus rhythm  Axis: Normal  Intervals: Normal  ST&T Change: None  ____________________________________________  RADIOLOGY I, Crary N Illeana Edick, personally viewed and evaluated these images (plain radiographs) as part of my medical decision making, as well as reviewing the written report by the radiologist.  ED MD interpretation: Chest x-ray revealed no active cardiopulmonary disease.  CT angiogram PE study revealed no evidence of pulmonary emboli, mild cardiomegaly  Official radiology report(s): DG Chest 2 View  Result Date: 09/03/2019 CLINICAL DATA:  Chest heaviness and chest pain EXAM: CHEST - 2 VIEW COMPARISON:  01/04/2014 FINDINGS: The heart size and mediastinal contours  are within normal limits. Both lungs are clear. The visualized skeletal structures are unremarkable. IMPRESSION: No active cardiopulmonary disease. Electronically Signed   By: 01/06/2014 M.D.   On: 09/03/2019 23:53   CT Angio Chest PE W and/or Wo Contrast  Result Date: 09/04/2019 CLINICAL DATA:  Chest pain and dyspnea. History of atrial fibrillation. Not on anticoagulation. EXAM: CT ANGIOGRAPHY CHEST WITH CONTRAST TECHNIQUE: Multidetector CT imaging of the chest was performed using the standard protocol during bolus administration of intravenous contrast. Multiplanar CT image reconstructions and MIPs were obtained to evaluate the vascular anatomy. CONTRAST:  65mL OMNIPAQUE IOHEXOL 350 MG/ML SOLN COMPARISON:  None. FINDINGS: Cardiovascular: Contrast injection is sufficient to demonstrate satisfactory opacification of the pulmonary arteries to the segmental level. There is no pulmonary embolus or evidence of right heart strain. The size of the main pulmonary artery is normal. Mild cardiomegaly. The course and caliber of the aorta are normal. There is no atherosclerotic calcification. Opacification decreased due to pulmonary arterial phase contrast bolus timing. Mediastinum/Nodes: No mediastinal, hilar or axillary  lymphadenopathy. Normal visualized thyroid. Thoracic esophageal course is normal. Lungs/Pleura: Airways are patent. No pleural effusion, lobar consolidation, pneumothorax or pulmonary infarction. Upper Abdomen: Contrast bolus timing is not optimized for evaluation of the abdominal organs. The visualized portions of the organs of the upper abdomen are normal. Musculoskeletal: No chest wall abnormality. No bony spinal canal stenosis. Review of the MIP images confirms the above findings. IMPRESSION: 1. No pulmonary embolus. 2. Mild cardiomegaly. Electronically Signed   By: Deatra Robinson M.D.   On: 09/04/2019 04:10      Procedures   ____________________________________________   INITIAL  IMPRESSION / MDM / ASSESSMENT AND PLAN / ED COURSE  As part of my medical decision making, I reviewed the following data within the electronic MEDICAL RECORD NUMBER   49 year old female presented with above-stated history and physical exam a differential diagnosis including but not limited to ACS/MI, pulmonary emboli, chest wall pain, anxiety.  EKG revealed no evidence of ischemia or infarction laboratory data including high-sensitivity troponin negative x2.  CT angiogram of the chest revealed no evidence of pulmonary emboli or aortic disease.  Spoke with the patient and her daughter at length regarding all clinical findings.  Patient advised to follow-up with Dr. Ihor Austin and primary care provider for further outpatient evaluation.  Of note the patient does admit to feelings of anxiety.  ____________________________________________  FINAL CLINICAL IMPRESSION(S) / ED DIAGNOSES  Final diagnoses:  Nonspecific chest pain     MEDICATIONS GIVEN DURING THIS VISIT:  Medications  aspirin chewable tablet 324 mg (324 mg Oral Given 09/04/19 0336)  iohexol (OMNIPAQUE) 350 MG/ML injection 75 mL (75 mLs Intravenous Contrast Given 09/04/19 0354)     ED Discharge Orders    None      *Please note:  Emberleigh Reily Leach was evaluated in Emergency Department on 09/04/2019 for the symptoms described in the history of present illness. She was evaluated in the context of the global COVID-19 pandemic, which necessitated consideration that the patient might be at risk for infection with the SARS-CoV-2 virus that causes COVID-19. Institutional protocols and algorithms that pertain to the evaluation of patients at risk for COVID-19 are in a state of rapid change based on information released by regulatory bodies including the CDC and federal and state organizations. These policies and algorithms were followed during the patient's care in the ED.  Some ED evaluations and interventions may be delayed as a result of limited staffing  during the pandemic.*  Note:  This document was prepared using Dragon voice recognition software and may include unintentional dictation errors.   Darci Current, MD 09/04/19 2232

## 2019-09-04 NOTE — ED Notes (Signed)
Pt updated on treatment plan. Pt verbalizes understanding.  

## 2020-06-11 ENCOUNTER — Encounter: Payer: Self-pay | Admitting: Emergency Medicine

## 2020-06-11 ENCOUNTER — Other Ambulatory Visit: Payer: Self-pay

## 2020-06-11 ENCOUNTER — Emergency Department
Admission: EM | Admit: 2020-06-11 | Discharge: 2020-06-11 | Disposition: A | Discharge status: Home / Self Care | Payer: 59 | Attending: Emergency Medicine | Admitting: Emergency Medicine

## 2020-06-11 DIAGNOSIS — U071 COVID-19: Secondary | ICD-10-CM | POA: Insufficient documentation

## 2020-06-11 DIAGNOSIS — R059 Cough, unspecified: Secondary | ICD-10-CM | POA: Diagnosis present

## 2020-06-11 LAB — POC SARS CORONAVIRUS 2 AG -  ED: SARS Coronavirus 2 Ag: POSITIVE — AB

## 2020-06-11 MED ORDER — ONDANSETRON 4 MG PO TBDP: 4.0000 mg | ORAL_TABLET | Freq: Four times a day (QID) | ORAL | 0 refills | Status: DC | PRN

## 2020-06-11 MED ORDER — BENZONATATE 100 MG PO CAPS: 100.0000 mg | ORAL_CAPSULE | Freq: Four times a day (QID) | ORAL | 0 refills | Status: DC | PRN

## 2020-06-11 NOTE — ED Triage Notes (Signed)
Patient to ER for c/o fever, general malaise, cough x3 days. Patient states daughter tested positive for Covid within last two weeks (works in home with daughter).

## 2020-06-11 NOTE — ED Provider Notes (Signed)
[facility] Emergency Department Provider Note   ____________________________________________   Event Date/Time   First MD Initiated Contact with Patient 06/11/20 1205     (approximate)  I have reviewed the triage vital signs and the nursing notes.   HISTORY  Chief Complaint Cough and Fever    HPI Rebecca Compton is a 50 y.o. female history of hypertension atrial fibrillation  Patient presents today reports that since Friday she is having a cough.  Couple days before that she had mild body aches and fatigue.  She felt tired throughout Friday and took the day off from work.  She continues to have just a persistent dry cough.  No shortness of breath no chest pain.  She feels slightly more fatigued but is eating and drinking well daughter has brought her Gatorade and water.  She had exposure to her daughter who tested positive for Covid about 2 weeks ago.  Additionally, she has not been vaccinated against COVID-19   Past Medical History:  Diagnosis Date  . Atrial fibrillation ([facility])   . Heart murmur   . Obesity, unspecified   . Other abnormal glucose   . Ulcerative colitis, unspecified   . Unspecified essential hypertension     Patient Active Problem List   Diagnosis Date Noted  . Heart murmur 02/03/2013  . Unspecified essential hypertension     Past Surgical History:  Procedure Laterality Date  . DILATION AND CURETTAGE OF UTERUS    . TUBAL LIGATION      Prior to Admission medications   Medication Sig Start Date End Date Taking? Authorizing Provider  benzonatate (TESSALON PERLES) 100 MG capsule Take 1 capsule (100 mg total) by mouth every 6 (six) hours as needed for cough. 06/11/20  Yes Sharyn Creamer, MD  acetaminophen (TYLENOL) 500 MG tablet Take 500-1,000 mg by mouth every 6 (six) hours as needed for mild pain or fever.     [provider]  amLODipine (NORVASC) 10 MG tablet Take 10 mg by mouth daily.    [provider]   chlorthalidone (HYGROTON) 25 MG tablet Take 25 mg by mouth daily.    [provider]  diltiazem (CARDIZEM CD) 120 MG 24 hr capsule Take 1 capsule (120 mg total) by mouth daily. 02/16/19 02/16/20  Minna Antis, MD  diltiazem (CARDIZEM) 30 MG tablet Take 1 tablet (30 mg total) by mouth daily as needed (Take as needed for atrial fibrillation and heart rate above 110 that resists your long-acting medicine). 02/23/19 03/25/19  Sharman Cheek, MD  lisinopril (PRINIVIL,ZESTRIL) 40 MG tablet Take 40 mg by mouth daily.    [provider]  meloxicam (MOBIC) 15 MG tablet Take 15 mg by mouth daily as needed for pain. 12/29/18   [provider]  ondansetron (ZOFRAN ODT) 4 MG disintegrating tablet Take 1 tablet (4 mg total) by mouth every 6 (six) hours as needed for nausea or vomiting. 06/11/20   Sharyn Creamer, MD    Allergies Hydrochlorothiazide  Family History  Problem Relation Age of Onset  . Hypertension Father     Social History Social History   Tobacco Use  . Smoking status: Never Smoker  . Smokeless tobacco: Never Used  Vaping Use  . Vaping Use: Never used  Substance Use Topics  . Alcohol use: No  . Drug use: No    Review of Systems Constitutional: The HPI Eyes: No visual changes. ENT: No sore throat. Cardiovascular: Denies chest pain. Respiratory: Denies shortness of breath.  Some  dry cough Gastrointestinal: No abdominal pain.  No nausea or vomiting.  No diarrhea. Genitourinary: Negative for dysuria.  Denies pregnancy Musculoskeletal: Negative for back pain. Skin: Negative for rash. Neurological: Negative for headaches, areas of focal weakness or numbness.    ____________________________________________   PHYSICAL EXAM:  VITAL SIGNS: ED Triage Vitals  Enc Vitals Group     BP 06/11/20 1050 (!) 142/81     Pulse Rate 06/11/20 1050 76     Resp 06/11/20 1050 20     Temp 06/11/20 1050 98.4 F (36.9 C)     Temp Source 06/11/20 1050 Oral      SpO2 06/11/20 1050 98 %     Weight 06/11/20 1051 248 lb 14.4 oz (112.9 kg)     Height 06/11/20 1051 5\' 5"  (1.651 m)     Head Circumference --      Peak Flow --      Pain Score 06/11/20 1051 2     Pain Loc --      Pain Edu? --      Excl. in GC? --     Constitutional: Alert and oriented. Well appearing and in no acute distress.  She is very pleasant. Eyes: Conjunctivae are normal. Head: Atraumatic. Nose: No congestion/rhinnorhea. Mouth/Throat: Mucous membranes are moist. Neck: No stridor.  Cardiovascular: Normal rate, regular rhythm. Grossly normal heart sounds.  Good peripheral circulation. Respiratory: Normal respiratory effort.  No retractions. Lungs CTAB. Gastrointestinal: Soft and nontender. No distention. Musculoskeletal: No lower extremity tenderness nor edema. Neurologic:  Normal speech and language. No gross focal neurologic deficits are appreciated.  Skin:  Skin is warm, dry and intact. No rash noted. Psychiatric: Mood and affect are normal. Speech and behavior are normal.  ____________________________________________   LABS (all labs ordered are listed, but only abnormal results are displayed)  Labs Reviewed  POC SARS CORONAVIRUS 2 AG -  ED - Abnormal; Notable for the following components:      Result Value   SARS Coronavirus 2 Ag POSITIVE (*)    All other components within normal limits  SARS CORONAVIRUS 2 (TAT 6-24 HRS)   ____________________________________________  EKG   ____________________________________________  RADIOLOGY  Normal oxygen level, clear lungs.  Denies dyspnea. ____________________________________________   PROCEDURES  Procedure(s) performed: None  Procedures  Critical Care performed: No  ____________________________________________   INITIAL IMPRESSION / ASSESSMENT AND PLAN / ED COURSE  Pertinent labs & imaging results that were available during my care of the patient were reviewed by me and considered in my medical decision  making (see chart for details).   Patient test positive for COVID-19, has known close exposure.  Have placed referral to the Covid ambulatory care follow-up pathway at [facility].  Discussed with the patient careful return precautions.  She is in agreement.  She is very nontoxic well-appearing, and I will prescribe her Zofran and Tessalon as needed.  Patient comfortable with this plan.  Careful return precautions discussed.  Return precautions and treatment recommendations and follow-up discussed with the patient who is agreeable with the plan.       ____________________________________________   FINAL CLINICAL IMPRESSION(S) / ED DIAGNOSES  Final diagnoses:  COVID-19        Note:  This document was prepared using Dragon voice recognition software and may include unintentional dictation errors       [facility], MD 06/11/20 1244

## 2020-06-12 ENCOUNTER — Telehealth: Payer: Self-pay

## 2020-06-12 NOTE — Telephone Encounter (Signed)
Called to discuss with patient about COVID-19 symptoms and the use of one of the available treatments for those with mild to moderate Covid symptoms and at a high risk of hospitalization.  Pt appears to qualify for outpatient treatment due to co-morbid conditions and/or a member of an at-risk group in accordance with the FDA Emergency Use Authorization.    Symptom onset: 06/08/20 per ED note Vaccinated: No Booster? No Immunocompromised? No Qualifiers: HTN  Unable to reach pt - Left message and call back number [phone].  Rebecca Compton

## 2021-04-24 ENCOUNTER — Other Ambulatory Visit: Payer: Self-pay | Admitting: Physician Assistant

## 2021-04-24 DIAGNOSIS — Z1231 Encounter for screening mammogram for malignant neoplasm of breast: Secondary | ICD-10-CM

## 2021-05-21 IMAGING — CT CT ANGIO CHEST
2 of 6 series · 18 of 46 positions shown · IV contrast (APPLIED)
Comparison: None.

CLINICAL DATA: Chest pain and dyspnea. History of atrial
fibrillation. Not on anticoagulation.

EXAM:
CT ANGIOGRAPHY CHEST WITH CONTRAST
TECHNIQUE: Multidetector CT imaging of the chest was performed using the
standard protocol during bolus administration of intravenous
contrast. Multiplanar CT image reconstructions and MIPs were
obtained to evaluate the vascular anatomy.
CONTRAST:  75mL OMNIPAQUE IOHEXOL 350 MG/ML SOLN

[Series 5: thins · axial · 0.68mm/px · z∈[-482,-250]mm · 15 of 256 slices shown]
[im 12/256  lung]
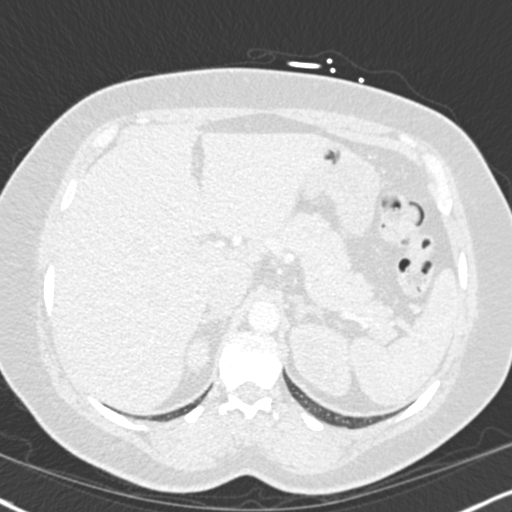
[im 34/256  soft-tissue]
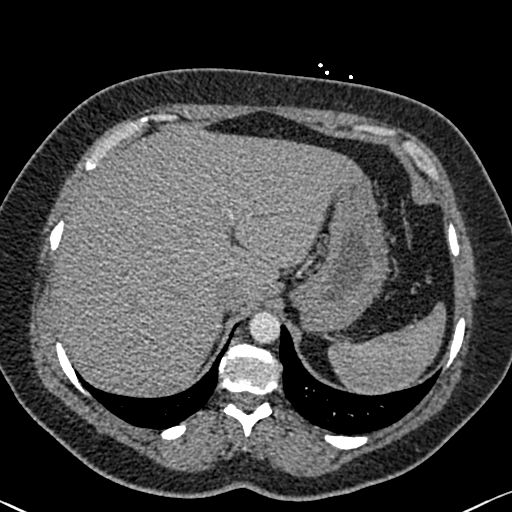
[im 45/256  lung]
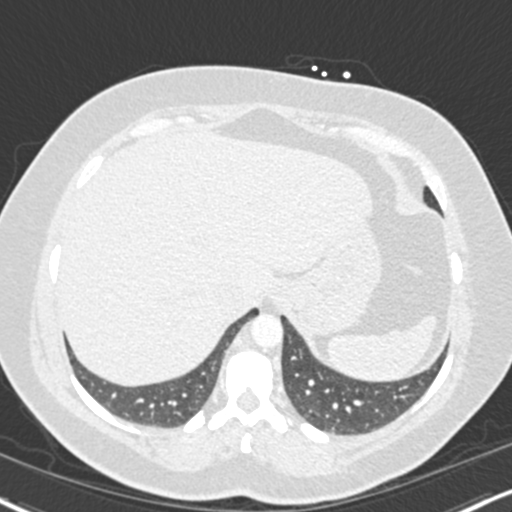
[im 67/256  soft-tissue]
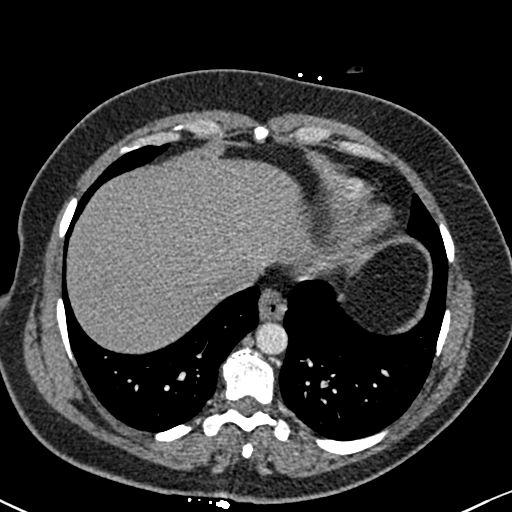
[im 78/256  lung]
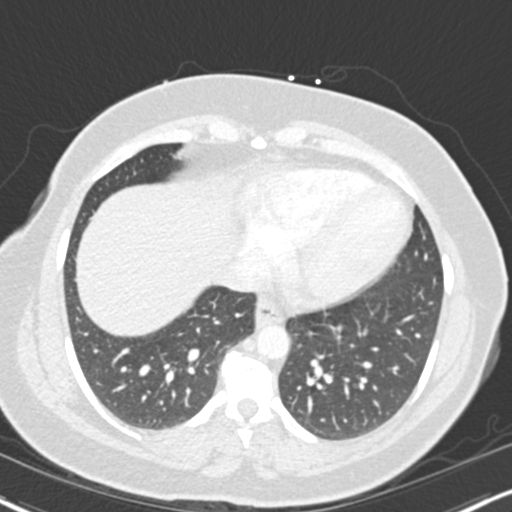
[im 100/256  soft-tissue]
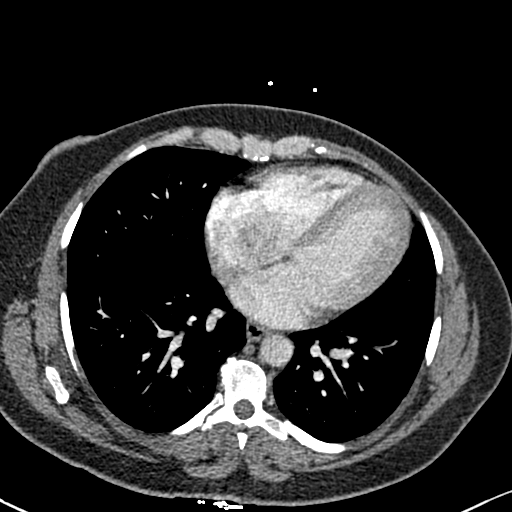
[im 111/256  lung]
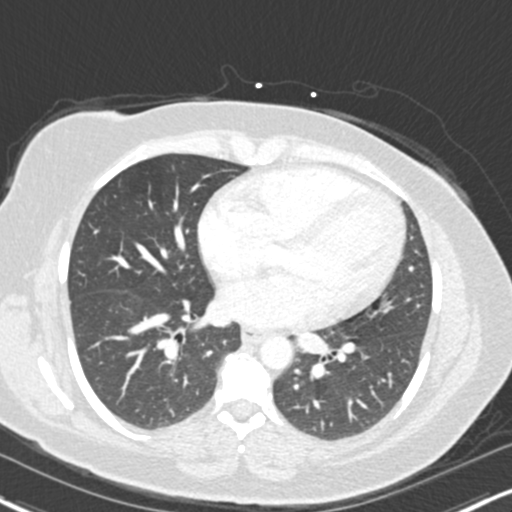
[im 134/256  soft-tissue]
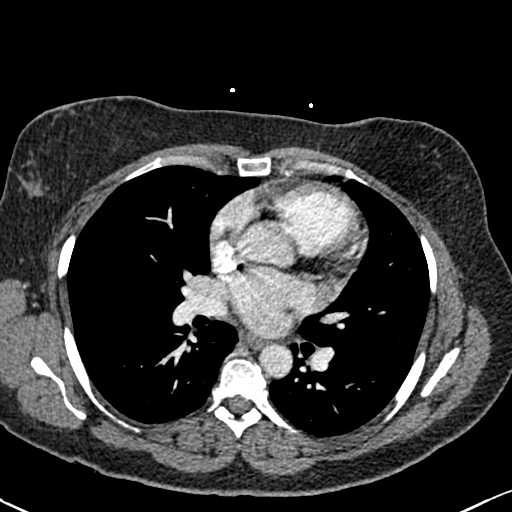
[im 145/256  lung]
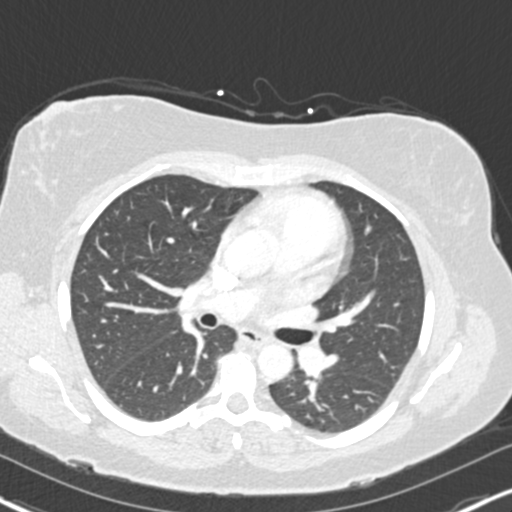
[im 156/256  soft-tissue]
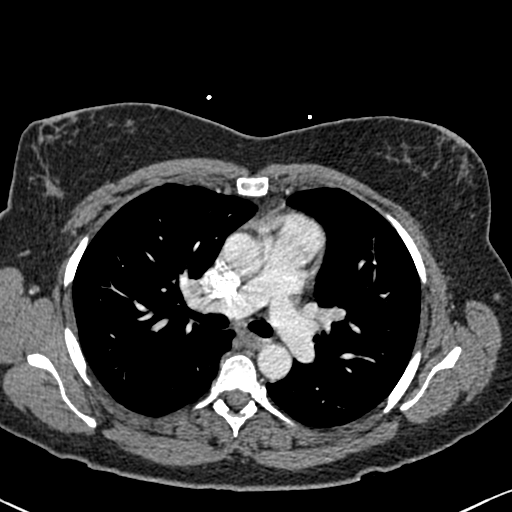
[im 178/256  lung]
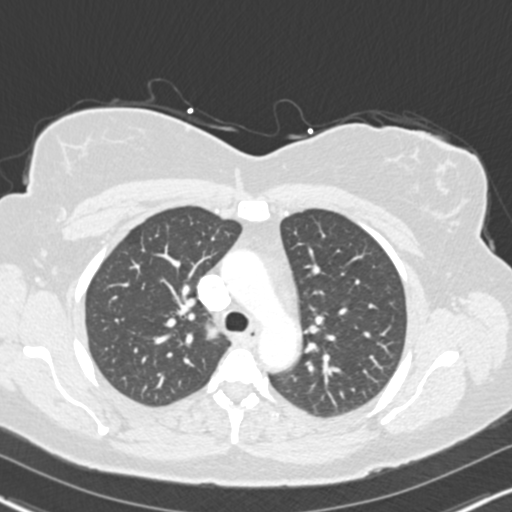
[im 189/256  soft-tissue]
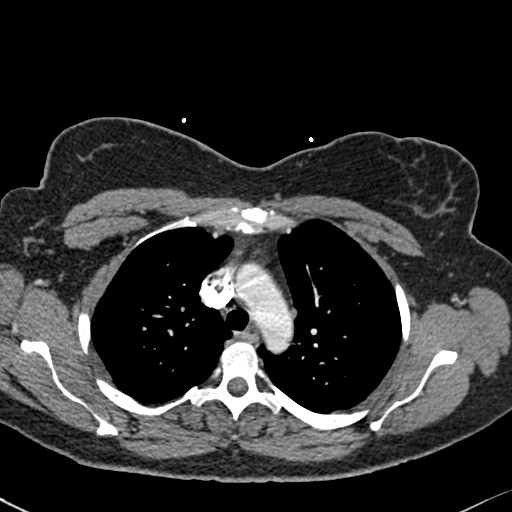
[im 211/256  lung]
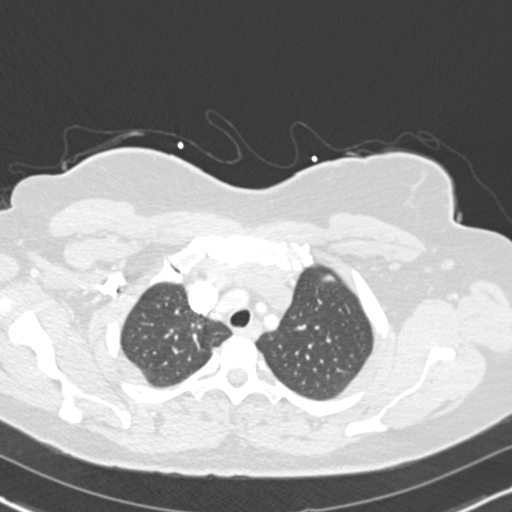
[im 222/256  soft-tissue]
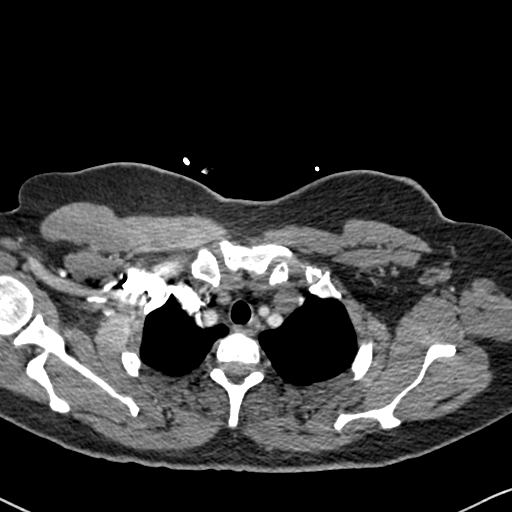
[im 244/256  lung]
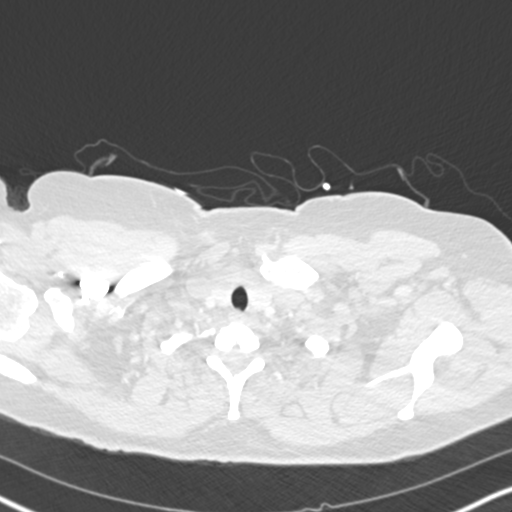

[Series 7: coronal mpr · coronal · 0.53mm/px · 3 of 78 slices shown]
[im 20/78  soft-tissue]
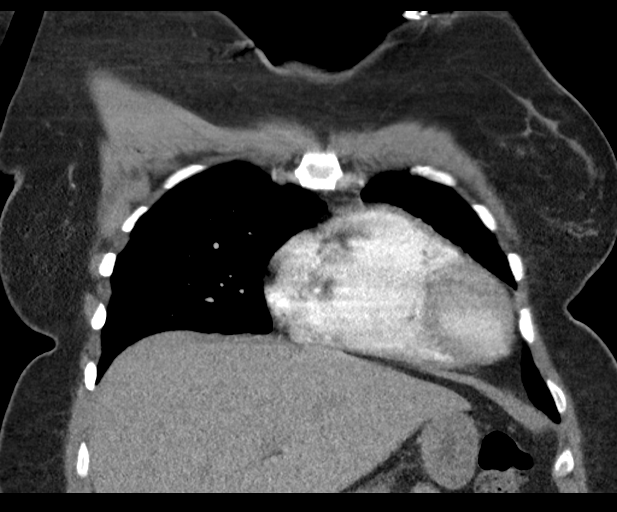
[im 39/78  soft-tissue]
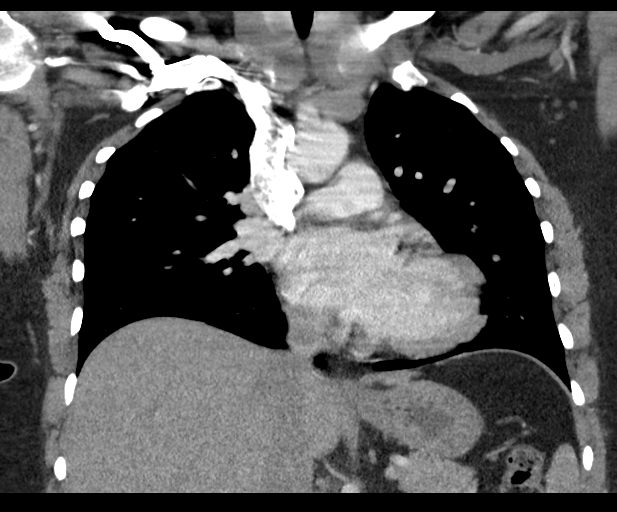
[im 58/78  soft-tissue]
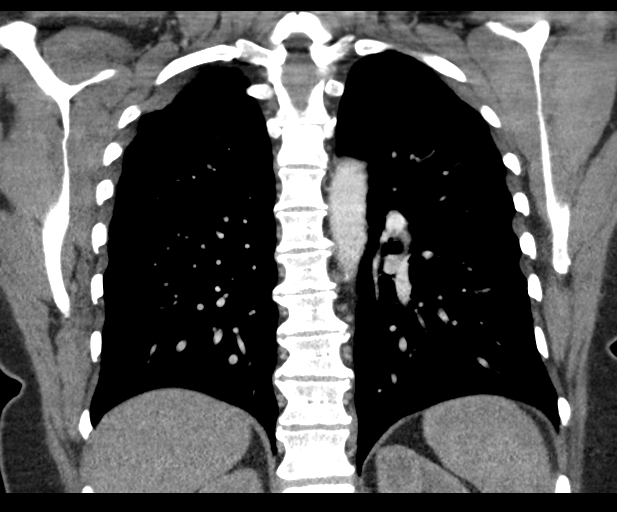

[18 of 46 positions shown; findings below may reference images not displayed]

FINDINGS: Cardiovascular: Contrast injection is sufficient to demonstrate
satisfactory opacification of the pulmonary arteries to the
segmental level. There is no pulmonary embolus or evidence of right
heart strain. The size of the main pulmonary artery is normal. Mild
cardiomegaly. The course and caliber of the aorta are normal. There
is no atherosclerotic calcification. Opacification decreased due to
pulmonary arterial phase contrast bolus timing.

Mediastinum/Nodes: No mediastinal, hilar or axillary
lymphadenopathy. Normal visualized thyroid. Thoracic esophageal
course is normal.

Lungs/Pleura: Airways are patent. No pleural effusion, lobar
consolidation, pneumothorax or pulmonary infarction.

Upper Abdomen: Contrast bolus timing is not optimized for evaluation
of the abdominal organs. The visualized portions of the organs of
the upper abdomen are normal.

Musculoskeletal: No chest wall abnormality. No bony spinal canal
stenosis.

Review of the MIP images confirms the above findings.
IMPRESSION: 1. No pulmonary embolus.
2. Mild cardiomegaly.

## 2022-06-24 ENCOUNTER — Other Ambulatory Visit: Payer: Self-pay | Admitting: Physician Assistant

## 2022-06-24 DIAGNOSIS — Z1231 Encounter for screening mammogram for malignant neoplasm of breast: Secondary | ICD-10-CM

## 2023-04-28 ENCOUNTER — Other Ambulatory Visit: Payer: Self-pay

## 2023-04-28 ENCOUNTER — Emergency Department
Admission: EM | Admit: 2023-04-28 | Discharge: 2023-04-28 | Disposition: A | Discharge status: Home / Self Care | Payer: 59 | Attending: Emergency Medicine | Admitting: Emergency Medicine

## 2023-04-28 ENCOUNTER — Encounter: Payer: Self-pay | Admitting: Emergency Medicine

## 2023-04-28 DIAGNOSIS — I4891 Unspecified atrial fibrillation: Secondary | ICD-10-CM | POA: Insufficient documentation

## 2023-04-28 DIAGNOSIS — R002 Palpitations: Secondary | ICD-10-CM | POA: Insufficient documentation

## 2023-04-28 LAB — BASIC METABOLIC PANEL
Anion gap: 11 (ref 5–15)
BUN: 16 mg/dL (ref 6–20)
CO2: 25 mmol/L (ref 22–32)
Calcium: 9.7 mg/dL (ref 8.9–10.3)
Chloride: 100 mmol/L (ref 98–111)
Creatinine, Ser: 1.02 mg/dL — ABNORMAL HIGH (ref 0.44–1.00)
GFR, Estimated: >60 mL/min (ref >60)
Glucose, Bld: 111 mg/dL — ABNORMAL HIGH (ref 70–99)
Potassium: 3.9 mmol/L (ref 3.5–5.1)
Sodium: 136 mmol/L (ref 135–145)

## 2023-04-28 LAB — CBC WITH DIFFERENTIAL/PLATELET
Abs Immature Granulocytes: 0.03 10*3/uL (ref 0.00–0.07)
Basophils Absolute: 0.1 10*3/uL (ref 0.0–0.1)
Basophils Relative: 1 %
Eosinophils Absolute: 0.1 10*3/uL (ref 0.0–0.5)
Eosinophils Relative: 1 %
HCT: 39.3 % (ref 36.0–46.0)
Hemoglobin: 12.5 g/dL (ref 12.0–15.0)
Immature Granulocytes: 0 %
Lymphocytes Relative: 24 %
Lymphs Abs: 1.9 10*3/uL (ref 0.7–4.0)
MCH: 24.5 pg — ABNORMAL LOW (ref 26.0–34.0)
MCHC: 31.8 g/dL (ref 30.0–36.0)
MCV: 76.9 fL — ABNORMAL LOW (ref 80.0–100.0)
Monocytes Absolute: 0.4 10*3/uL (ref 0.1–1.0)
Monocytes Relative: 5 %
Neutro Abs: 5.6 10*3/uL (ref 1.7–7.7)
Neutrophils Relative %: 69 %
Platelets: 424 10*3/uL — ABNORMAL HIGH (ref 150–400)
RBC: 5.11 MIL/uL (ref 3.87–5.11)
RDW: 14.9 % (ref 11.5–15.5)
WBC: 8 10*3/uL (ref 4.0–10.5)
nRBC: 0 % (ref 0.0–0.2)

## 2023-04-28 LAB — T4, FREE: Free T4: 0.85 ng/dL (ref 0.61–1.12)

## 2023-04-28 LAB — TROPONIN I (HIGH SENSITIVITY): Troponin I (High Sensitivity): 3 ng/L (ref <18)

## 2023-04-28 LAB — TSH: TSH: 2.094 u[IU]/mL (ref 0.350–4.500)

## 2023-04-28 LAB — MAGNESIUM: Magnesium: 2.1 mg/dL (ref 1.7–2.4)

## 2023-04-28 NOTE — ED Provider Notes (Signed)
[facility] Provider Note    Event Date/Time   First MD Initiated Contact with Patient 04/28/23 1029     (approximate)   History   Palpitations   HPI  Rebecca Compton is a 52 y.o. female   Past medical history of atrial fibrillation on diltiazem, who presents to the emergency department with palpitations starting last night.  She forgot to take her diltiazem in the morning and then at nighttime she felt fluttering in her chest and lightheaded.  The symptoms lasted a few hours and then subsided after she took her medications.  Otherwise has been in her regular state of health and denies any recent illnesses, denies chest pain or shortness of breath.    Independent Historian contributed to assessment above: Her friend is at bedside to corroborate information past medical history as above    Physical Exam   Triage Vital Signs: ED Triage Vitals  Encounter Vitals Group     BP 04/28/23 0957 (!) 140/71     Systolic BP Percentile --      Diastolic BP Percentile --      Pulse Rate 04/28/23 0957 82     Resp 04/28/23 0957 18     Temp 04/28/23 0957 98.6 F (37 C)     Temp Source 04/28/23 0957 Oral     SpO2 04/28/23 0957 96 %     Weight 04/28/23 0955 248 lb 14.4 oz (112.9 kg)     Height 04/28/23 0955 5\' 5"  (1.651 m)     Head Circumference --      Peak Flow --      Pain Score 04/28/23 0955 0     Pain Loc --      Pain Education --      Exclude from Growth Chart --     Most recent vital signs: Vitals:   04/28/23 0957  BP: (!) 140/71  Pulse: 82  Resp: 18  Temp: 98.6 F (37 C)  SpO2: 96%    General: Awake, no distress.  CV:  Good peripheral perfusion.  Resp:  Normal effort.  Abd:  No distention.  Other:  Wake alert comfortable slightly hypertensive otherwise vital signs normal.  Regular rate and rhythm on auscultation of the heart, clear lungs.  Soft nontender abdomen.   ED Results / Procedures / Treatments   Labs (all labs ordered are listed,  but only abnormal results are displayed) Labs Reviewed  BASIC METABOLIC PANEL - Abnormal; Notable for the following components:      Result Value   Glucose, Bld 111 (*)    Creatinine, Ser 1.02 (*)    All other components within normal limits  CBC WITH DIFFERENTIAL/PLATELET - Abnormal; Notable for the following components:   MCV 76.9 (*)    MCH 24.5 (*)    Platelets 424 (*)    All other components within normal limits  MAGNESIUM  TSH  T4, FREE  TROPONIN I (HIGH SENSITIVITY)     I ordered and reviewed the above labs they are notable for cell counts electrolytes and thyroid function normal.  EKG  ED ECG REPORT I, Pilar Jarvis, the attending physician, personally viewed and interpreted this ECG.   Date: 04/28/2023  EKG Time: 0957  Rate: 82  Rhythm: sinus  Axis: nl  Intervals:none  ST&T Change:  no stemi      Critical Care performed: No  Procedures   MEDICATIONS ORDERED IN ED: Medications - No data to display  IMPRESSION / MDM /  ASSESSMENT AND PLAN / ED COURSE  I reviewed the triage vital signs and the nursing notes.                                Patient's presentation is most consistent with acute presentation with potential threat to life or bodily function.  Differential diagnosis includes, but is not limited to, atrial fibrillation, electrolyte abnormality, thyroid dysfunction   The patient is on the cardiac monitor to evaluate for evidence of arrhythmia and/or significant heart rate changes.  MDM:    This is a patient with atrial fibrillation who probably had atrial fibrillation with RVR that resolved with home medications, in the setting of delayed dosing of her diltiazem which he admission in the morning.    She is in normal sinus rhythm now with normal electrolytes and normal troponin normal thyroid function testing.  She has an appointment with her cardiologist in the morning.  Advised her to continue taking her medications as prescribed and follow-up  with her cardiologist in the morning.  Discharge.      FINAL CLINICAL IMPRESSION(S) / ED DIAGNOSES   Final diagnoses:  Palpitations     Rx / DC Orders   ED Discharge Orders     None        Note:  This document was prepared using Dragon voice recognition software and may include unintentional dictation errors.    Pilar Jarvis, MD 04/28/23 [phone]

## 2023-04-28 NOTE — ED Notes (Signed)
See triage note  Presents with some palpitations which started last pm  Hx of afib  and has not had any issues for couple of years

## 2023-04-28 NOTE — Discharge Instructions (Signed)
Continue taking your medications as prescribed and follow-up with your cardiologist in the morning as scheduled.  Thank you for choosing [place] for your health care today!  Please see your primary doctor this week for a follow up appointment.   If you have any new, worsening, or unexpected symptoms call your doctor right away or come back to the emergency department for reevaluation.  It was my pleasure to care for you today.   Daneil Dan Modesto Charon, MD

## 2023-04-28 NOTE — ED Triage Notes (Signed)
Pt here with a fib, has a hx of the same. Pt states last night she felt like her rhythm has been off. Pt denies cp or SOB. Pt was also dizzy last night as well. Pt ambulatory and stable in triage.

## 2023-04-28 NOTE — ED Provider Triage Note (Signed)
Emergency Medicine Provider Triage Evaluation Note  Rebecca Compton , a 52 y.o. female  was evaluated in triage.  Pt complains of feeling like she is in afib. She has a history of afib, has been taking meds for this for 2-3 years. She has been feeling like this since about last night around 10 pm.  Review of Systems  Positive: Dizziness, palpitations Negative: CP, SOB  Physical Exam  There were no vitals taken for this visit. Gen:   Awake, no distress   Resp:  Normal effort  MSK:   Moves extremities without difficulty  Other:    Medical Decision Making  Medically screening exam initiated at 9:54 AM.  Appropriate orders placed.  Rebecca Compton was informed that the remainder of the evaluation will be completed by another provider, this initial triage assessment does not replace that evaluation, and the importance of remaining in the ED until their evaluation is complete.     Cameron Ali, PA-C 04/28/23 [phone]

## 2023-06-20 DIAGNOSIS — C541 Malignant neoplasm of endometrium: Secondary | ICD-10-CM

## 2023-06-20 HISTORY — DX: Malignant neoplasm of endometrium: C54.1

## 2023-07-14 ENCOUNTER — Other Ambulatory Visit: Payer: Self-pay | Admitting: Obstetrics and Gynecology

## 2023-07-14 DIAGNOSIS — C541 Malignant neoplasm of endometrium: Secondary | ICD-10-CM

## 2023-07-15 ENCOUNTER — Inpatient Hospital Stay: Payer: 59 | Attending: Obstetrics and Gynecology | Admitting: Obstetrics and Gynecology

## 2023-07-15 DIAGNOSIS — I10 Essential (primary) hypertension: Secondary | ICD-10-CM | POA: Diagnosis not present

## 2023-07-15 DIAGNOSIS — R7303 Prediabetes: Secondary | ICD-10-CM | POA: Diagnosis not present

## 2023-07-15 DIAGNOSIS — N950 Postmenopausal bleeding: Secondary | ICD-10-CM | POA: Diagnosis not present

## 2023-07-15 DIAGNOSIS — E785 Hyperlipidemia, unspecified: Secondary | ICD-10-CM | POA: Insufficient documentation

## 2023-07-15 DIAGNOSIS — Z79899 Other long term (current) drug therapy: Secondary | ICD-10-CM | POA: Insufficient documentation

## 2023-07-15 DIAGNOSIS — K5190 Ulcerative colitis, unspecified, without complications: Secondary | ICD-10-CM | POA: Insufficient documentation

## 2023-07-15 DIAGNOSIS — I4891 Unspecified atrial fibrillation: Secondary | ICD-10-CM | POA: Insufficient documentation

## 2023-07-15 DIAGNOSIS — I480 Paroxysmal atrial fibrillation: Secondary | ICD-10-CM | POA: Insufficient documentation

## 2023-07-15 DIAGNOSIS — Z6841 Body Mass Index (BMI) 40.0 and over, adult: Secondary | ICD-10-CM | POA: Diagnosis not present

## 2023-07-15 DIAGNOSIS — E669 Obesity, unspecified: Secondary | ICD-10-CM | POA: Diagnosis not present

## 2023-07-15 DIAGNOSIS — C541 Malignant neoplasm of endometrium: Secondary | ICD-10-CM | POA: Diagnosis present

## 2023-07-15 DIAGNOSIS — D500 Iron deficiency anemia secondary to blood loss (chronic): Secondary | ICD-10-CM | POA: Insufficient documentation

## 2023-07-15 NOTE — Progress Notes (Signed)
 Met with patient to review pre-operative teaching for planned surgery scheduled for July 29, 2023. Pre-admit phone screen appointment discussed. Teaching included but not limited to labs, bowel prep and dietary restrictions, surgical location, pain scales/management, guidelines to prevent constipation, and importance of early ambulation post operatively and follow-up care. Written instructions for pre-op and post-op care, contact information for questions and concerns provided to patient. Patient able to repeat instructions to nursing staff. No further questions at this time. Patient agrees with plan to proceed with scheduled procedure.

## 2023-07-15 NOTE — Progress Notes (Signed)
 Gynecologic Oncology Consult Visit   Referring Provider: Dr Kathalene Frames  Chief Complaint: Grade 1 endometrial adenocarcinoma  Subjective:  Rebecca Compton is a 53 y.o. P2 female who is seen in consultation from Dr. Feliberto Gottron for newly diagnosed endometrial adenocarcinoma.   Patient has history of obseity, htn, hld, uc, prediabetic, intermittent pAfib on diltiazem, who presented to gynecology for PMB. No history of HRT since menopause age 72.  NSVD x 2.  Pelvic ultrasound 07/07/23 11x7x5cm uterus, fluid filled endometrium measuring 6.27mm vs 10.18mm (including fluid), 2.5cm complex R ovarian cyst   Pap History: 07/19/15 NILM with neg HRHPV 04/24/21 NILM  06/24/23 AGC with neg HRHPV   EMB, colpo with biopsies and ECC on 07/07/23 Cervix looked normal. Part A-Cervical Biopsy,3:00- FRAGMENTS OF ADENOCARCINOMA, CONSISTENT WITH ENDOMETRIOID ADENOCARCINOMA (FIGO I) WITH SQUAMOUS METAPLASIA.   Part B-Cervical Biopsy,6:00- BENIGN ECTOCERVICAL TISSUE. NEGATIVE FOR DYSPLASIA AND MALIGNANCY. TRANSFORMATION ZONE NOT REPRESENTED.   Part C-Cervical Biopsy,9:00- MINUTE FRAGMENTS OF BENIGN ENDOCERVICAL GLANDS, SQUAMOUS EPITHELIUM, BLOOD, AND MUCUS. EXTREMELY SCANTY. NO DYSPLASIA.   Part D-Cervical Biopsy,10:00- BENIGN ECTOCERVICAL TISSUE, MIXED WITH FRAGMENTS OF ADENOCARCINOMA, CONSISTENT WITH ENDOMETRIOID ADENOCARCINOMA (FIGO I).   Part E-Cervical Biopsy,12:00- MILD CHRONIC CERVICITIS AND FRAGMENTS OF  ADENOCARCINOMA WITH SQUAMOUS METAPLASIA, CONSISTENT WITH  ENDOMETRIOID ADENOCARCINOMA (FIGO I).  Part F-cervical tissue/clot ,Cervical Biopsy- FRAGMENTS OF ADENOCARCINOMA, WITH SQUAMOUS METAPLASIA, CONSISTENT WITH ENDOMETRIOID ADENOCARCINOMA (FIGO I), MIXED WITH MINUTE FRAGMENTS OF BENIGN  ENDOCERVICAL GLANDS, SQUAMOUS EPITHELIUM, BLOOD, AND MUCUS.  Part G-Endometrial Biopsy - ENDOMETRIOID ADENOCARCINOMA (FIGO I) WITH SQUAMOUS METAPLASIA.   Part H-Endocervical Curettings- FRAGMENTS OF  ADENOCARCINOMA, WITH SQUAMOUS METAPLASIA, CONSISTENT WITH ENDOMETRIOID ADENOCARCINOMA (FIGO I).   Scheduled for CT scan later this week.    Problem List: Patient Active Problem List   Diagnosis Date Noted   Atrial fibrillation (HCC) 07/15/2023   Heart murmur 02/03/2013   Essential hypertension     Past Medical History: Past Medical History:  Diagnosis Date   Atrial fibrillation (HCC)    Heart murmur    Obesity, unspecified    Other abnormal glucose    Ulcerative colitis, unspecified    Unspecified essential hypertension     Past Surgical History: Past Surgical History:  Procedure Laterality Date   DILATION AND CURETTAGE OF UTERUS     TUBAL LIGATION      Family History: Family History  Problem Relation Age of Onset   Hypertension Father     Social History: Social History   Socioeconomic History   Marital status: Widowed    Spouse name: Not on file   Number of children: Not on file   Years of education: Not on file   Highest education level: Not on file  Occupational History   Not on file  Tobacco Use   Smoking status: Never   Smokeless tobacco: Never  Vaping Use   Vaping status: Never Used  Substance and Sexual Activity   Alcohol use: No   Drug use: No   Sexual activity: Yes  Other Topics Concern   Not on file  Social History Narrative   Not on file   Social Drivers of Health   Financial Resource Strain: Low Risk  (06/24/2023)   Received from [facility]   Overall Financial Resource Strain (CARDIA)    Difficulty of Paying Living Expenses: Not hard at all  Food Insecurity: No Food Insecurity (06/24/2023)   Received from [facility]   Hunger Vital Sign    Worried About Running Out of Food  in the Last Year: Never true    Ran Out of Food in the Last Year: Never true  Transportation Needs: No Transportation Needs (06/24/2023)   Received from [facility] - Transportation    In the past 12  months, has lack of transportation kept you from medical appointments or from getting medications?: No    Lack of Transportation (Non-Medical): No  Physical Activity: Not on file  Stress: Not on file  Social Connections: Not on file  Intimate Partner Violence: Not on file    Allergies: Allergies  Allergen Reactions   Hydrochlorothiazide Other (See Comments)    Chest Pain    Current Medications: Current Outpatient Medications  Medication Sig Dispense Refill   chlorthalidone (HYGROTON) 25 MG tablet Take 25 mg by mouth daily.     diltiazem (CARDIZEM CD) 120 MG 24 hr capsule Take 1 capsule (120 mg total) by mouth daily. (Patient taking differently: Take 240 mg by mouth daily.) 30 capsule 0   diltiazem (CARDIZEM) 30 MG tablet Take 1 tablet (30 mg total) by mouth daily as needed (Take as needed for atrial fibrillation and heart rate above 110 that resists your long-acting medicine). 30 tablet 0   escitalopram (LEXAPRO) 10 MG tablet Take 10 mg by mouth daily.     lisinopril (PRINIVIL,ZESTRIL) 40 MG tablet Take 40 mg by mouth daily.     pantoprazole (PROTONIX) 40 MG tablet Take 40 mg by mouth daily.     acetaminophen (TYLENOL) 500 MG tablet Take 500-1,000 mg by mouth every 6 (six) hours as needed for mild pain or fever.  (Patient not taking: Reported on 07/15/2023)     amLODipine (NORVASC) 10 MG tablet Take 10 mg by mouth daily. (Patient not taking: Reported on 07/15/2023)     benzonatate (TESSALON PERLES) 100 MG capsule Take 1 capsule (100 mg total) by mouth every 6 (six) hours as needed for cough. (Patient not taking: Reported on 07/15/2023) 20 capsule 0   meloxicam (MOBIC) 15 MG tablet Take 15 mg by mouth daily as needed for pain. (Patient not taking: Reported on 07/15/2023)     ondansetron (ZOFRAN ODT) 4 MG disintegrating tablet Take 1 tablet (4 mg total) by mouth every 6 (six) hours as needed for nausea or vomiting. (Patient not taking: Reported on 07/15/2023) 20 tablet 0   No current  facility-administered medications for this visit.    General: negative for fevers, changes in weight or night sweats Skin: negative for changes in moles or sores or rash Eyes: negative for changes in vision HEENT: negative for change in hearing, tinnitus, voice changes Pulmonary: negative for dyspnea, orthopnea, productive cough, wheezing Cardiac: negative for palpitations, pain Gastrointestinal: negative for nausea, vomiting, constipation, diarrhea, hematemesis, hematochezia Genitourinary/Sexual: negative for dysuria, retention, hematuria, incontinence Ob/Gyn:  per HPI Musculoskeletal: negative for pain, joint pain, back pain Hematology: negative for easy bruising, abnormal bleeding Neurologic/Psych: negative for headaches, seizures, paralysis, weakness, numbness   Objective:  Physical Examination:  Pulse 79   Temp (!) 97 F (36.1 C) (Tympanic)   Resp 20   Wt (!) 303 lb (137.4 kg)   BMI 50.42 kg/m     ECOG Performance Status: 1 - Symptomatic but completely ambulatory  General appearance: alert, cooperative, and appears stated age HEENT: mucus membranes moist no mucosal lesions posterior pharynx benign PERRL EOMI sclera clear Neck: no thyroid enlargement or cervical adenopathy Lymph node survey: non-palpable, axillary, inguinal, supraclavicular Cardiovascular: without murmurs, rubs or gallops Respiratory: clear to auscultation  Abdomen: no palpable masses, no hernias, well healed incision, soft, nontender, nondistended, bowel sounds present, and without hepatosplenomegaly Back: inspection of back is normal Extremities: no lower extremity edema Skin exam - normal coloration and turgor, no rashes, no suspicious skin lesions noted. Neurological exam reveals alert, oriented, normal speech, no focal findings or movement disorder noted.  Pelvic: Exam chaperoned by RN EGBUS: no lesions Cervix: no lesions, nontender, mobile Vagina: no lesions, no discharge or bleeding Uterus:  normal size, nontender, mobile Adnexa: no palpable masses Rectovaginal: confirmatory  Lab Review Labs on site today: Lab Results  Component Value Date   WBC 8.0 04/28/2023   HGB 12.5 04/28/2023   HCT 39.3 04/28/2023   MCV 76.9 (L) 04/28/2023   PLT 424 (H) 04/28/2023     Chemistry      Component Value Date/Time   NA 136 04/28/2023 1007   NA 140 01/04/2014 1214   K 3.9 04/28/2023 1007   K 3.9 01/04/2014 1214   CL 100 04/28/2023 1007   CL 105 01/04/2014 1214   CO2 25 04/28/2023 1007   CO2 27 01/04/2014 1214   BUN 16 04/28/2023 1007   BUN 10 01/04/2014 1214   CREATININE 1.02 (H) 04/28/2023 1007   CREATININE 0.93 01/04/2014 1214      Component Value Date/Time   CALCIUM 9.7 04/28/2023 1007   CALCIUM 9.0 01/04/2014 1214   ALKPHOS 55 02/16/2019 1017   AST 18 02/16/2019 1017   ALT 13 02/16/2019 1017   BILITOT 0.8 02/16/2019 1017      05/27/23 - outside labs HgbA1c 6.3, vitamin B12 = 237 (low), normal TFTs and TSH   CBC w/auto Differential (5 Part) Order: [id number] Component Ref Range & Units 1 mo ago  WBC (White Blood Cell Count) 4.1 - 10.2 10^3/uL 8.5  RBC (Red Blood Cell Count) 4.04 - 5.48 10^6/uL 4.59  Hemoglobin 12.0 - 15.0 gm/dL 40.9 Low   Hematocrit 81.1 - 47.0 % 36  MCV (Mean Corpuscular Volume) 80.0 - 100.0 fl 78.4 Low   MCH (Mean Corpuscular Hemoglobin) 27.0 - 31.2 pg 25.5 Low   MCHC (Mean Corpuscular Hemoglobin Concentration) 32.0 - 36.0 gm/dL 91.4  Platelet Count 782 - 450 10^3/uL 388  RDW-CV (Red Cell Distribution Width) 11.6 - 14.8 % 16.1 High   MPV (Mean Platelet Volume) 9.4 - 12.4 fl 9.3 Low   Neutrophils 1.50 - 7.80 10^3/uL 5.66  Lymphocytes 1.00 - 3.60 10^3/uL 2.23  Monocytes 0.00 - 1.50 10^3/uL 0.47  Eosinophils 0.00 - 0.55 10^3/uL 0.11  Basophils 0.00 - 0.09 10^3/uL 0.05  Neutrophil % 32.0 - 70.0 % 66.3  Lymphocyte % 10.0 - 50.0 % 26.1  Monocyte % 4.0 - 13.0 % 5.5  Eosinophil % 1.0 - 5.0 % 1.3  Basophil% 0.0 - 2.0 % 0.6   Immature Granulocyte % <=0.7 % 0.2  Immature Granulocyte Count <=0.06 10^3/L 0.02  Resulting Agency [facility]    Comprehensive Metabolic Panel (CMP) Order: [id number] Component Ref Range & Units 1 mo ago  Glucose 70 - 110 mg/dL 90  Sodium 578 - 469 mmol/L 137  Potassium 3.6 - 5.1 mmol/L 4.1  Chloride 97 - 109 mmol/L 99  Carbon Dioxide (CO2) 22.0 - 32.0 mmol/L 28  Urea Nitrogen (BUN) 7 - 25 mg/dL 18  Creatinine 0.6 - 1.1 mg/dL 1  Glomerular Filtration Rate (eGFR) >60 mL/min/1.73sq m 67  Comment: CKD-EPI (2021) does not include patient's race in the calculation of eGFR.  Monitoring changes of plasma creatinine and  eGFR over time is useful for monitoring kidney function.  Interpretive Ranges for eGFR (CKD-EPI 2021):  eGFR:       >60 mL/min/1.73 sq. m - Normal eGFR:       30-59 mL/min/1.73 sq. m - Moderately Decreased eGFR:       15-29 mL/min/1.73 sq. m  - Severely Decreased eGFR:       < 15 mL/min/1.73 sq. m  - Kidney Failure   Note: These eGFR calculations do not apply in acute situations when eGFR is changing rapidly or patients on dialysis.  Calcium 8.7 - 10.3 mg/dL 9.9  AST 8 - 39 U/L 12  ALT 5 - 38 U/L 12  Alk Phos (alkaline Phosphatase) 34 - 104 U/L 71  Albumin 3.5 - 4.8 g/dL 4.3  Bilirubin, Total 0.3 - 1.2 mg/dL 0.8  Protein, Total 6.1 - 7.9 g/dL 7.4  A/G Ratio 1.0 - 5.0 gm/dL 1.4  Resulting Agency [facility]   Specimen Collected: 05/27/23 16:40   Performed by: [facility] Last Resulted: 05/27/23 17:55     Assessment:  Rebecca Compton is a 53 y.o. female with PMB and PAP showing atypical glandular cells.  US showed endometrial fluid, thickened endometrial stripe and 2.5cm complex R ovarian cyst.  Endometrial biopsy showed grade 1 endometrial adenocarcinoma. Colposcopy with ECC and cervical biopsies done in view of abnormal PAP and showed fragments of grade 1 adenocarcinoma, but cervix appears normal.   Suspect that this is a primary endometrial cancer with spillage of tumor into the other biopsies.  She will have a CT scan to further rule out cervical involvement or other spread of cancer.   Mild anemia due to PMB and prediabetic.  Atrial fibrillation, but normal rhythm on diltiazem.  Medical co-morbidities complicating care: intermittent atrial fibrillation, HTN, BMI 50, prediabetic HgbA1c slightly elevated.  Plan:   Problem List Items Addressed This Visit       Genitourinary   Endometrial cancer (HCC) - Primary   We discussed options for management including TLH/BSO and SLN mapping and biopsies, possible pelvic/aortic LN biopsies and laparotomy at [facility] on 07/29/23.    The risks of surgery were discussed in detail and she understands these to include infection; wound separation; hernia; vaginal cuff separation, injury to adjacent organs such as bowel, bladder, blood vessels, ureters and nerves; bleeding which may require blood transfusion; anesthesia risk; thromboembolic events; possible death; unforeseen complications; possible need for re-exploration; medical complications such as heart attack, stroke, pleural effusion and pneumonia; and, if staging performed the risk of lymphedema and lymphocyst.  The patient will receive DVT and antibiotic prophylaxis as indicated.  She voiced a clear understanding.  She had the opportunity to ask questions and written informed consent was obtained today.  Suggested return to clinic post op.    The patient's diagnosis, an outline of the further diagnostic and laboratory studies which will be required, the recommendation for surgery, and alternatives were discussed with her and her accompanying family members.  All questions were answered to their satisfaction.  A total of 60 minutes were spent with the patient/family today; 50% was spent in education, counseling and coordination of care for endometrial cancer.    Alinda Dooms, NP  I personally  interviewed and examined the patient. Agreed with the above/below plan of care. I have directly contributed to assessment and plan of care of this patient and educated and discussed with patient and family.  Leida Lauth, MD  CC:  Schermerhorn, Festus Holts, MD [address] [place],  [place] [address] [phone]

## 2023-07-15 NOTE — Patient Instructions (Addendum)
 Your surgery will be scheduled July 29, 2023 You will have a pre-admission telephone visit prior to surgery. This visit will take around 40-45 minutes. Gyn oncology will see you 4-6 weeks following surgery for a post operative visit.                    Laparoscopy Laparoscopy is a procedure to diagnose diseases in the abdomen. During the procedure, a thin, lighted, pencil-sized instrument called a laparoscope is inserted into the abdomen through an incision. The laparoscope allows your health care provider to look at the organs inside your body. LET [facility] CARE PROVIDER KNOW ABOUT: Any allergies you have. All medicines you are taking, including vitamins, herbs, eye drops, creams, and over-the-counter medicines. Previous problems you or members of your family have had with the use of anesthetics. Any blood disorders you have. Previous surgeries you have had. Medical conditions you have. RISKS AND COMPLICATIONS  Generally, this is a safe procedure. However, problems can occur, which may include: Infection. Bleeding. Damage to other organs. Allergic reaction to the anesthetics used during the procedure. BEFORE THE PROCEDURE Do not eat or drink anything after midnight on the night before the procedure or as directed by your health care provider. Ask your health care provider about: Changing or stopping your regular medicines. Taking medicines such as aspirin and ibuprofen. These medicines can thin your blood. Do not take these medicines before your procedure if your health care provider instructs you not to. Plan to have someone take you home after the procedure. PROCEDURE You may be given a medicine to help you relax (sedative). You will be given a medicine to make you sleep (general anesthetic). Your abdomen will be inflated with a gas. This will make your organs easier to see. Small incisions will be made in your abdomen. A laparoscope and other small instruments will be inserted  into the abdomen through the incisions. A tissue sample may be removed from an organ in the abdomen for examination. The instruments will be removed from the abdomen. The gas will be released. The incisions will be closed with stitches (sutures). AFTER THE PROCEDURE  Your blood pressure, heart rate, breathing rate, and blood oxygen level will be monitored often until the medicines you were given have worn off.   This information is not intended to replace advice given to you by your health care provider. Make sure you discuss any questions you have with your health care provider.                                             Bowel Symptoms After Surgery After gynecologic surgery, women often have temporary changes in bowel function (constipation and gas pain).  Following are tips to help prevent and treat common bowel problems.  It also tells you when to call the doctor.  This is important because some symptoms might be a sign of a more serious bowel problem such as obstruction (bowel blockage).  These problems are rare but can happen after gynecologic surgery.   Besides surgery, what can temporarily affect bowel function? 1. Dietary changes   2. Decreased physical activity   3.Antibiotics   4. Pain medication   How can I prevent constipation (three days or more without a stool)? Include fiber in your diet: whole grains, raw or dried fruits & vegetables, prunes, prune/pear juiceDrink at  least 8 glasses of liquid (preferably water) every day Avoid: Gas forming foods such as broccoli, beans, peas, salads, cabbage, sweet potatoes Greasy, fatty, or fried foods Activity helps bowel function return to normal, walk around the house at least 3-4 times each day for 15 minutes or longer, if tolerated.  Rocking in a rocking chair is preferable to sitting still. Stool softeners: these are not laxatives, but serve to soften the stool to avoid straining.  Take 2-4 times a day until normal bowel function  returns         Examples: Colace or generic equivalent (Docusate) Bulk laxatives: provide a concentrated source of fiber.  They do not stimulate the bowel.  Take 1-2 times each day until normal bowel function return.              Examples: Citrucel, Metamucil, Fiberal, Fibercon   What can I take for "Gas Pains"? Simethicone (Mylicon, Gas-X, Maalox-Gas, Mylanta-Gas) take 3-4 times a day Maalox Regular - take 3-4 times a day Mylanta Regular - take 3-4 times a day   What can I take if I become constipated? Start with stool softeners and add additional laxatives below as needed to have a bowel movement every 1-2 days  Stool softeners 1-2 tablets, 2 times a day Senokot 1-2 tablets, 1-2 times a day Glycerin suppository can soften hard stool take once a day Bisacodyl suppository once a day  Milk of Magnesia 30 mL 1-2 times a day Fleets or tap water enema    What can I do for nausea?  Limit most solid foods for 24-48 hours Continue eating small frequent amounts of liquids and/or bland soft foods Toast, crackers, cooked cereal (grits, cream of wheat, rice) Benadryl: a mild anti-nausea medicine can be obtained without a prescription. May cause drowsiness, especially if taken with narcotic pain medicines Contact provider for prescription nausea medication     What can I do, or take for diarrhea (more than five loose stools per day)? Drink plenty of clear fluids to prevent dehydration May take Kaopectate, Pepto-Bismol, Imodium, or probiotics for 1-2 days Anusol or Preparation-H can be helpful for hemorrhoids and irritated tissue around anus   When should I call the doctor?             CONSTIPATION:  Not relieved after three days following the above program VOMITING: That contains blood, "coffee ground" material More the three times/hour and unable to keep down nausea medication for more than eight hours With dry mouth, dark or strong urine, feeling light-headed, dizzy, or confused With  severe abdominal pain or bloating for more than 24 hours DIARRHEA: That continues for more then 24-48 hours despite treatment That contains blood or tarry material With dry mouth, dark or strong urine, feeling light~headed, dizzy, or confused FEVER: 101 F or higher along with nausea, vomiting, gas pain, diarrhea UNABLE TO: Pass gas from rectum for more than 24 hours Tolerate liquids by mouth for more than 24 hours        Laparoscopic Hysterectomy, Care After Refer to this sheet in the next few weeks. These instructions provide you with information on caring for yourself after your procedure. Your health care provider may also give you more specific instructions. Your treatment has been planned according to current medical practices, but problems sometimes occur. Call your health care provider if you have any problems or questions after your procedure. What can I expect after the procedure? Pain and bruising at the incision sites. You will be given  pain medicine to control it. Menopausal symptoms such as hot flashes, night sweats, and insomnia if your ovaries were removed. Sore throat from the breathing tube that was inserted during surgery. Follow these instructions at home: Only take over-the-counter or prescription medicines for pain, discomfort, or fever as directed by your health care provider. Do not take aspirin. It can cause bleeding. Do not drive when taking pain medicine. Follow your health care provider's advice regarding diet, exercise, lifting, driving, and general activities. Resume your usual diet as directed and allowed. Get plenty of rest and sleep. Do not douche, use tampons, or have sexual intercourse for at least 6 weeks, or until your health care provider gives you permission. Change your bandages (dressings) as directed by your health care provider. Monitor your temperature and notify your health care provider of a fever. Take showers instead of baths for 2-3  weeks. Do not drink alcohol until your health care provider gives you permission. If you develop constipation, you may take a mild laxative with your health care provider's permission. Bran foods may help with constipation problems. Drinking enough fluids to keep your urine clear or pale yellow may help as well. Try to have someone home with you for 1-2 weeks to help around the house. Keep all of your follow-up appointments as directed by your health care provider. Contact a health care provider if: You have swelling, redness, or increasing pain around your incision sites. You have pus coming from your incision. You notice a bad smell coming from your incision. Your incision breaks open. You feel dizzy or lightheaded. You have pain or bleeding when you urinate. You have persistent diarrhea. You have persistent nausea and vomiting. You have abnormal vaginal discharge. You have a rash. You have any type of abnormal reaction or develop an allergy to your medicine. You have poor pain control with your prescribed medicine. Get help right away if: You have chest pain or shortness of breath. You have severe abdominal pain that is not relieved with pain medicine. You have pain or swelling in your legs. This information is not intended to replace advice given to you by your health care provider. Make sure you discuss any questions you have with your health care provider. Document Released: 02/23/2013 Document Revised: 10/11/2015 Document Reviewed: 11/23/2012 Elsevier Interactive Patient Education  2017 [employer].

## 2023-07-17 ENCOUNTER — Ambulatory Visit
Admission: RE | Admit: 2023-07-17 | Discharge: 2023-07-17 | Disposition: A | Discharge status: Home / Self Care | Payer: 59 | Source: Ambulatory Visit | Attending: Obstetrics and Gynecology | Admitting: Obstetrics and Gynecology

## 2023-07-17 DIAGNOSIS — C541 Malignant neoplasm of endometrium: Secondary | ICD-10-CM

## 2023-07-17 MED ORDER — IOPAMIDOL (ISOVUE-370) INJECTION 76%
100.0000 mL | Freq: Once | INTRAVENOUS | Status: AC | PRN
  Administered 2023-07-17: 100 mL via INTRAVENOUS

## 2023-07-22 ENCOUNTER — Encounter
Admission: RE | Admit: 2023-07-22 | Discharge: 2023-07-22 | Disposition: A | Discharge status: Home / Self Care | Payer: 59 | Source: Ambulatory Visit | Attending: Obstetrics and Gynecology | Admitting: Obstetrics and Gynecology

## 2023-07-22 ENCOUNTER — Other Ambulatory Visit: Payer: Self-pay

## 2023-07-22 HISTORY — DX: Gastro-esophageal reflux disease without esophagitis: K21.9

## 2023-07-22 HISTORY — DX: Prediabetes: R73.03

## 2023-07-22 HISTORY — DX: Ulcerative colitis, unspecified, without complications: K51.90

## 2023-07-22 HISTORY — DX: Paroxysmal atrial fibrillation: I48.0

## 2023-07-22 HISTORY — DX: Morbid (severe) obesity due to excess calories: E66.01

## 2023-07-22 HISTORY — DX: Postmenopausal bleeding: N95.0

## 2023-07-22 HISTORY — DX: Essential (primary) hypertension: I10

## 2023-07-22 HISTORY — DX: Unspecified ovarian cyst, right side: N83.201

## 2023-07-22 NOTE — Patient Instructions (Addendum)
 Your procedure is scheduled on: Wednesday, March 12 Report to the Registration Desk on the 1st floor of the [facility] To find out your arrival time, please call [phone] between 1PM - 3PM on: Tuesday, March 11 If your arrival time is 6:00 am, do not arrive before that time as the [facility] entrance doors do not open until 6:00 am.  REMEMBER: Instructions that are not followed completely may result in serious medical risk, up to and including death; or upon the discretion of your surgeon and anesthesiologist your surgery may need to be rescheduled.  Do not eat or drink after midnight the night before surgery.  No gum chewing or hard candies.  One week prior to surgery: starting March 5 Stop Anti-inflammatories (NSAIDS) such as Advil, Aleve, Ibuprofen, Motrin, Naproxen, Naprosyn and Aspirin based products such as Excedrin, Goody's Powder, BC Powder. Stop ANY OVER THE COUNTER supplements until after surgery.  You may however, continue to take Tylenol if needed for pain up until the day of surgery.  Continue taking all of your other prescription medications up until the day of surgery.  ON THE DAY OF SURGERY ONLY TAKE THESE MEDICATIONS WITH SIPS OF WATER:  diltiazem (CARDIZEM CD)  escitalopram (LEXAPRO)  pantoprazole (PROTONIX)   No Alcohol for 24 hours before or after surgery.  No Smoking including e-cigarettes for 24 hours before surgery.  No chewable tobacco products for at least 6 hours before surgery.  No nicotine patches on the day of surgery.  Do not use any "recreational" drugs for at least a week (preferably 2 weeks) before your surgery.  Please be advised that the combination of cocaine and anesthesia may have negative outcomes, up to and including death. If you test positive for cocaine, your surgery will be cancelled.  On the morning of surgery brush your teeth with toothpaste and water, you may rinse your mouth with mouthwash if you wish. Do not swallow any  toothpaste or mouthwash.  Use CHG Soap as directed on instruction sheet.  Do not wear jewelry, make-up, hairpins, clips or nail polish.  For welded (permanent) jewelry: bracelets, anklets, waist bands, etc.  Please have this removed prior to surgery.  If it is not removed, there is a chance that hospital personnel will need to cut it off on the day of surgery.  Do not wear lotions, powders, or perfumes.   Do not shave body hair from the neck down 48 hours before surgery.  Contact lenses, hearing aids and dentures may not be worn into surgery.  Do not bring valuables to the hospital. [facility] is not responsible for any missing/lost belongings or valuables.   Notify your doctor if there is any change in your medical condition (cold, fever, infection).  Wear comfortable clothing (specific to your surgery type) to the hospital.  After surgery, you can help prevent lung complications by doing breathing exercises.  Take deep breaths and cough every 1-2 hours. Your doctor may order a device called an Incentive Spirometer to help you take deep breaths.  If you are being discharged the day of surgery, you will not be allowed to drive home. You will need a responsible individual to drive you home and stay with you for 24 hours after surgery.   If you are taking public transportation, you will need to have a responsible individual with you.  Please call the Pre-admissions Testing Dept. at [phone] if you have any questions about these instructions.  Surgery Visitation Policy:  Patients  having surgery or a procedure may have two visitors.  Children under the age of 30 must have an adult with them who is not the patient.  Temporary Visitor Restrictions Due to increasing cases of flu, RSV and COVID-19: Children ages 36 and under will not be able to visit patients in [facility] hospitals under most circumstances.       Preparing for Surgery with CHLORHEXIDINE GLUCONATE (CHG)  Soap  Chlorhexidine Gluconate (CHG) Soap  o An antiseptic cleaner that kills germs and bonds with the skin to continue killing germs even after washing  o Used for showering the night before surgery and morning of surgery  Before surgery, you can play an important role by reducing the number of germs on your skin.  CHG (Chlorhexidine gluconate) soap is an antiseptic cleanser which kills germs and bonds with the skin to continue killing germs even after washing.  Please do not use if you have an allergy to CHG or antibacterial soaps. If your skin becomes reddened/irritated stop using the CHG.  1. Shower the NIGHT BEFORE SURGERY and the MORNING OF SURGERY with CHG soap.  2. If you choose to wash your hair, wash your hair first as usual with your normal shampoo.  3. After shampooing, rinse your hair and body thoroughly to remove the shampoo.  4. Use CHG as you would any other liquid soap. You can apply CHG directly to the skin and wash gently with a scrungie or a clean washcloth.  5. Apply the CHG soap to your body only from the neck down. Do not use on open wounds or open sores. Avoid contact with your eyes, ears, mouth, and genitals (private parts). Wash face and genitals (private parts) with your normal soap.  6. Wash thoroughly, paying special attention to the area where your surgery will be performed.  7. Thoroughly rinse your body with warm water.  8. Do not shower/wash with your normal soap after using and rinsing off the CHG soap.  9. Pat yourself dry with a clean towel.  10. Wear clean pajamas to bed the night before surgery.  12. Place clean sheets on your bed the night of your first shower and do not sleep with pets.  13. Shower again with the CHG soap on the day of surgery prior to arriving at the hospital.  14. Do not apply any deodorants/lotions/powders.  15. Please wear clean clothes to the hospital.

## 2023-07-24 ENCOUNTER — Encounter
Admission: RE | Admit: 2023-07-24 | Discharge: 2023-07-24 | Disposition: A | Discharge status: Home / Self Care | Source: Ambulatory Visit | Attending: Obstetrics and Gynecology | Admitting: Obstetrics and Gynecology

## 2023-07-24 DIAGNOSIS — R8281 Pyuria: Secondary | ICD-10-CM | POA: Insufficient documentation

## 2023-07-24 DIAGNOSIS — R8290 Unspecified abnormal findings in urine: Secondary | ICD-10-CM | POA: Insufficient documentation

## 2023-07-24 DIAGNOSIS — Z01818 Encounter for other preprocedural examination: Secondary | ICD-10-CM | POA: Diagnosis present

## 2023-07-24 DIAGNOSIS — Z01812 Encounter for preprocedural laboratory examination: Secondary | ICD-10-CM

## 2023-07-24 DIAGNOSIS — R8271 Bacteriuria: Secondary | ICD-10-CM | POA: Diagnosis not present

## 2023-07-24 DIAGNOSIS — C541 Malignant neoplasm of endometrium: Secondary | ICD-10-CM | POA: Insufficient documentation

## 2023-07-24 LAB — CBC WITH DIFFERENTIAL/PLATELET
Abs Immature Granulocytes: 0.02 10*3/uL (ref 0.00–0.07)
Basophils Absolute: 0.1 10*3/uL (ref 0.0–0.1)
Basophils Relative: 1 %
Eosinophils Absolute: 0.1 10*3/uL (ref 0.0–0.5)
Eosinophils Relative: 1 %
HCT: 37.2 % (ref 36.0–46.0)
Hemoglobin: 11.9 g/dL — ABNORMAL LOW (ref 12.0–15.0)
Immature Granulocytes: 0 %
Lymphocytes Relative: 24 %
Lymphs Abs: 2 10*3/uL (ref 0.7–4.0)
MCH: 25.4 pg — ABNORMAL LOW (ref 26.0–34.0)
MCHC: 32 g/dL (ref 30.0–36.0)
MCV: 79.3 fL — ABNORMAL LOW (ref 80.0–100.0)
Monocytes Absolute: 0.4 10*3/uL (ref 0.1–1.0)
Monocytes Relative: 5 %
Neutro Abs: 5.7 10*3/uL (ref 1.7–7.7)
Neutrophils Relative %: 69 %
Platelets: 399 10*3/uL (ref 150–400)
RBC: 4.69 MIL/uL (ref 3.87–5.11)
RDW: 17.4 % — ABNORMAL HIGH (ref 11.5–15.5)
WBC: 8.3 10*3/uL (ref 4.0–10.5)
nRBC: 0 % (ref 0.0–0.2)

## 2023-07-24 LAB — COMPREHENSIVE METABOLIC PANEL WITH GFR
ALT: 13 U/L (ref 0–44)
AST: 16 U/L (ref 15–41)
Albumin: 4.1 g/dL (ref 3.5–5.0)
Alkaline Phosphatase: 59 U/L (ref 38–126)
Anion gap: 14 (ref 5–15)
BUN: 26 mg/dL — ABNORMAL HIGH (ref 6–20)
CO2: 23 mmol/L (ref 22–32)
Calcium: 9.5 mg/dL (ref 8.9–10.3)
Chloride: 100 mmol/L (ref 98–111)
Creatinine, Ser: 0.97 mg/dL (ref 0.44–1.00)
GFR, Estimated: >60 mL/min
Glucose, Bld: 91 mg/dL (ref 70–99)
Potassium: 3.7 mmol/L (ref 3.5–5.1)
Sodium: 137 mmol/L (ref 135–145)
Total Bilirubin: 0.7 mg/dL (ref 0.0–1.2)
Total Protein: 7.9 g/dL (ref 6.5–8.1)

## 2023-07-24 LAB — URINALYSIS, ROUTINE W REFLEX MICROSCOPIC
Bilirubin Urine: NEGATIVE
Glucose, UA: NEGATIVE mg/dL
Ketones, ur: NEGATIVE mg/dL
Nitrite: NEGATIVE
Protein, ur: 100 mg/dL — AB
Specific Gravity, Urine: 1.024 (ref 1.005–1.030)
WBC, UA: >50 WBC/hpf (ref 0–5)
pH: 5 (ref 5.0–8.0)

## 2023-07-24 LAB — TYPE AND SCREEN
ABO/RH(D): O POS
Antibody Screen: NEGATIVE

## 2023-07-25 LAB — URINE CULTURE

## 2023-07-28 MED ORDER — CHLORHEXIDINE GLUCONATE 0.12 % MT SOLN
15.0000 mL | Freq: Once | OROMUCOSAL | Status: AC
  Administered 2023-07-29: 15 mL via OROMUCOSAL

## 2023-07-28 MED ORDER — CHLORHEXIDINE GLUCONATE CLOTH 2 % EX PADS
6.0000 | MEDICATED_PAD | Freq: Once | CUTANEOUS | Status: DC
Start: 2023-07-28 — End: 2023-07-29

## 2023-07-28 MED ORDER — CEFAZOLIN SODIUM-DEXTROSE 3-4 GM/150ML-% IV SOLN
3.0000 g | INTRAVENOUS | Status: AC
  Administered 2023-07-29: 3 g via INTRAVENOUS
  Filled 2023-07-28: qty 150

## 2023-07-28 MED ORDER — CHLORHEXIDINE GLUCONATE CLOTH 2 % EX PADS: 6.0000 | MEDICATED_PAD | Freq: Once | CUTANEOUS | Status: DC

## 2023-07-28 MED ORDER — METRONIDAZOLE 500 MG/100ML IV SOLN
500.0000 mg | INTRAVENOUS | Status: AC
  Administered 2023-07-29: 500 mg via INTRAVENOUS
  Filled 2023-07-28: qty 100

## 2023-07-28 MED ORDER — LACTATED RINGERS IV SOLN: INTRAVENOUS | Status: DC

## 2023-07-28 MED ORDER — ORAL CARE MOUTH RINSE: 15.0000 mL | Freq: Once | OROMUCOSAL | Status: AC

## 2023-07-28 MED ORDER — CEFAZOLIN SODIUM-DEXTROSE 3-4 GM/150ML-% IV SOLN
3.0000 g | INTRAVENOUS | Status: DC
  Filled 2023-07-28: qty 150

## 2023-07-29 ENCOUNTER — Other Ambulatory Visit: Payer: Self-pay

## 2023-07-29 ENCOUNTER — Encounter
Admission: RE | Disposition: A | Discharge status: Home / Self Care | Payer: Self-pay | Source: Ambulatory Visit | Attending: Obstetrics and Gynecology

## 2023-07-29 ENCOUNTER — Ambulatory Visit: Payer: Self-pay | Admitting: Urgent Care

## 2023-07-29 ENCOUNTER — Encounter: Payer: Self-pay | Admitting: Obstetrics and Gynecology

## 2023-07-29 ENCOUNTER — Ambulatory Visit
Admission: RE | Admit: 2023-07-29 | Discharge: 2023-07-29 | Disposition: A | Discharge status: Home / Self Care | Payer: 59 | Source: Ambulatory Visit | Attending: Obstetrics and Gynecology | Admitting: Obstetrics and Gynecology

## 2023-07-29 DIAGNOSIS — Z170 Estrogen receptor positive status [ER+]: Secondary | ICD-10-CM | POA: Diagnosis not present

## 2023-07-29 DIAGNOSIS — R7303 Prediabetes: Secondary | ICD-10-CM | POA: Insufficient documentation

## 2023-07-29 DIAGNOSIS — K219 Gastro-esophageal reflux disease without esophagitis: Secondary | ICD-10-CM | POA: Diagnosis not present

## 2023-07-29 DIAGNOSIS — Z6841 Body Mass Index (BMI) 40.0 and over, adult: Secondary | ICD-10-CM | POA: Diagnosis not present

## 2023-07-29 DIAGNOSIS — I10 Essential (primary) hypertension: Secondary | ICD-10-CM | POA: Insufficient documentation

## 2023-07-29 DIAGNOSIS — D500 Iron deficiency anemia secondary to blood loss (chronic): Secondary | ICD-10-CM | POA: Diagnosis not present

## 2023-07-29 DIAGNOSIS — C541 Malignant neoplasm of endometrium: Secondary | ICD-10-CM | POA: Diagnosis present

## 2023-07-29 DIAGNOSIS — D259 Leiomyoma of uterus, unspecified: Secondary | ICD-10-CM | POA: Insufficient documentation

## 2023-07-29 DIAGNOSIS — I480 Paroxysmal atrial fibrillation: Secondary | ICD-10-CM | POA: Diagnosis not present

## 2023-07-29 DIAGNOSIS — N83202 Unspecified ovarian cyst, left side: Secondary | ICD-10-CM | POA: Insufficient documentation

## 2023-07-29 DIAGNOSIS — N888 Other specified noninflammatory disorders of cervix uteri: Secondary | ICD-10-CM | POA: Insufficient documentation

## 2023-07-29 DIAGNOSIS — E6689 Other obesity not elsewhere classified: Secondary | ICD-10-CM | POA: Diagnosis not present

## 2023-07-29 DIAGNOSIS — N83201 Unspecified ovarian cyst, right side: Secondary | ICD-10-CM | POA: Insufficient documentation

## 2023-07-29 DIAGNOSIS — Z9851 Tubal ligation status: Secondary | ICD-10-CM | POA: Diagnosis not present

## 2023-07-29 DIAGNOSIS — Z1721 Progesterone receptor positive status: Secondary | ICD-10-CM | POA: Diagnosis not present

## 2023-07-29 DIAGNOSIS — N838 Other noninflammatory disorders of ovary, fallopian tube and broad ligament: Secondary | ICD-10-CM | POA: Insufficient documentation

## 2023-07-29 HISTORY — PX: CYSTOSCOPY: SHX5120

## 2023-07-29 HISTORY — PX: TOTAL LAPAROSCOPIC HYSTERECTOMY WITH BILATERAL SALPINGO OOPHORECTOMY: SHX6845

## 2023-07-29 LAB — ABO/RH: ABO/RH(D): O POS

## 2023-07-29 SURGERY — HYSTERECTOMY, TOTAL, LAPAROSCOPIC, WITH BILATERAL SALPINGO-OOPHORECTOMY
Anesthesia: General | Site: Cervix

## 2023-07-29 MED ORDER — ONDANSETRON HCL 4 MG/2ML IJ SOLN
INTRAMUSCULAR | Status: DC | PRN
  Administered 2023-07-29: 4 mg via INTRAVENOUS

## 2023-07-29 MED ORDER — OXYCODONE HCL 5 MG PO TABS
ORAL_TABLET | ORAL | Status: AC
  Filled 2023-07-29: qty 1

## 2023-07-29 MED ORDER — 0.9 % SODIUM CHLORIDE (POUR BTL) OPTIME
TOPICAL | Status: DC | PRN
  Administered 2023-07-29: 500 mL

## 2023-07-29 MED ORDER — HYDROMORPHONE HCL 1 MG/ML IJ SOLN: 0.2500 mg | INTRAMUSCULAR | Status: DC | PRN

## 2023-07-29 MED ORDER — DEXAMETHASONE SODIUM PHOSPHATE 10 MG/ML IJ SOLN
INTRAMUSCULAR | Status: DC | PRN
  Administered 2023-07-29: 10 mg via INTRAVENOUS

## 2023-07-29 MED ORDER — PHENYLEPHRINE HCL-NACL 20-0.9 MG/250ML-% IV SOLN
INTRAVENOUS | Status: DC | PRN
  Administered 2023-07-29: 50 ug/min via INTRAVENOUS

## 2023-07-29 MED ORDER — ACETAMINOPHEN 10 MG/ML IV SOLN
INTRAVENOUS | Status: DC | PRN
  Administered 2023-07-29: 1000 mg via INTRAVENOUS

## 2023-07-29 MED ORDER — ONDANSETRON HCL 4 MG/2ML IJ SOLN
INTRAMUSCULAR | Status: AC
  Filled 2023-07-29: qty 2

## 2023-07-29 MED ORDER — BUPIVACAINE HCL (PF) 0.5 % IJ SOLN
INTRAMUSCULAR | Status: AC
  Filled 2023-07-29: qty 30

## 2023-07-29 MED ORDER — ACETAMINOPHEN 10 MG/ML IV SOLN
INTRAVENOUS | Status: AC
  Filled 2023-07-29: qty 100

## 2023-07-29 MED ORDER — DEXAMETHASONE SODIUM PHOSPHATE 10 MG/ML IJ SOLN
INTRAMUSCULAR | Status: AC
  Filled 2023-07-29: qty 1

## 2023-07-29 MED ORDER — SUCCINYLCHOLINE CHLORIDE 200 MG/10ML IV SOSY
PREFILLED_SYRINGE | INTRAVENOUS | Status: DC | PRN
  Administered 2023-07-29: 140 mg via INTRAVENOUS

## 2023-07-29 MED ORDER — FENTANYL CITRATE (PF) 100 MCG/2ML IJ SOLN
INTRAMUSCULAR | Status: AC
  Filled 2023-07-29: qty 2

## 2023-07-29 MED ORDER — ROCURONIUM BROMIDE 100 MG/10ML IV SOLN
INTRAVENOUS | Status: DC | PRN
  Administered 2023-07-29: 10 mg via INTRAVENOUS
  Administered 2023-07-29: 50 mg via INTRAVENOUS
  Administered 2023-07-29 (×2): 20 mg via INTRAVENOUS

## 2023-07-29 MED ORDER — PHENYLEPHRINE 80 MCG/ML (10ML) SYRINGE FOR IV PUSH (FOR BLOOD PRESSURE SUPPORT)
PREFILLED_SYRINGE | INTRAVENOUS | Status: DC | PRN
  Administered 2023-07-29: 160 ug via INTRAVENOUS
  Administered 2023-07-29: 80 ug via INTRAVENOUS

## 2023-07-29 MED ORDER — LIDOCAINE HCL (PF) 2 % IJ SOLN
INTRAMUSCULAR | Status: AC
  Filled 2023-07-29: qty 5

## 2023-07-29 MED ORDER — DROPERIDOL 2.5 MG/ML IJ SOLN: 0.6250 mg | Freq: Once | INTRAMUSCULAR | Status: DC | PRN

## 2023-07-29 MED ORDER — SODIUM CHLORIDE (PF) 0.9 % IJ SOLN
INTRAMUSCULAR | Status: AC
  Filled 2023-07-29: qty 10

## 2023-07-29 MED ORDER — METHYLENE BLUE (ANTIDOTE) 1 % IV SOLN
INTRAVENOUS | Status: AC
  Filled 2023-07-29: qty 10

## 2023-07-29 MED ORDER — SUGAMMADEX SODIUM 200 MG/2ML IV SOLN
INTRAVENOUS | Status: DC | PRN
  Administered 2023-07-29: 400 mg via INTRAVENOUS

## 2023-07-29 MED ORDER — DEXMEDETOMIDINE HCL IN NACL 80 MCG/20ML IV SOLN
INTRAVENOUS | Status: DC | PRN
  Administered 2023-07-29: 12 ug via INTRAVENOUS

## 2023-07-29 MED ORDER — ONDANSETRON HCL 4 MG PO TABS: 4.0000 mg | ORAL_TABLET | Freq: Three times a day (TID) | ORAL | 1 refills | Status: AC | PRN

## 2023-07-29 MED ORDER — HEPARIN SODIUM (PORCINE) 5000 UNIT/ML IJ SOLN
5000.0000 [IU] | Freq: Once | INTRAMUSCULAR | Status: AC
  Administered 2023-07-29: 5000 [IU] via SUBCUTANEOUS

## 2023-07-29 MED ORDER — HYDROMORPHONE HCL 1 MG/ML IJ SOLN
INTRAMUSCULAR | Status: DC | PRN
  Administered 2023-07-29: .5 mg via INTRAVENOUS

## 2023-07-29 MED ORDER — OXYCODONE HCL 5 MG PO TABS: 5.0000 mg | ORAL_TABLET | Freq: Four times a day (QID) | ORAL | 0 refills | Status: AC | PRN

## 2023-07-29 MED ORDER — ACETAMINOPHEN 500 MG PO TABS: 1000.0000 mg | ORAL_TABLET | Freq: Three times a day (TID) | ORAL | 0 refills | Status: AC

## 2023-07-29 MED ORDER — FLUORESCEIN SODIUM 10 % IV SOLN
INTRAVENOUS | Status: AC
  Filled 2023-07-29: qty 5

## 2023-07-29 MED ORDER — SURGIFLO WITH THROMBIN (HEMOSTATIC MATRIX KIT) OPTIME
TOPICAL | Status: DC | PRN
  Administered 2023-07-29: 1

## 2023-07-29 MED ORDER — LIDOCAINE HCL (CARDIAC) PF 100 MG/5ML IV SOSY
PREFILLED_SYRINGE | INTRAVENOUS | Status: DC | PRN
  Administered 2023-07-29: 100 mg via INTRAVENOUS

## 2023-07-29 MED ORDER — MIDAZOLAM HCL 2 MG/2ML IJ SOLN
INTRAMUSCULAR | Status: AC
  Filled 2023-07-29: qty 2

## 2023-07-29 MED ORDER — FENTANYL CITRATE (PF) 100 MCG/2ML IJ SOLN
25.0000 ug | INTRAMUSCULAR | Status: DC | PRN
  Administered 2023-07-29: 25 ug via INTRAVENOUS

## 2023-07-29 MED ORDER — BUPIVACAINE HCL (PF) 0.5 % IJ SOLN
INTRAMUSCULAR | Status: DC | PRN
  Administered 2023-07-29: 10 mL

## 2023-07-29 MED ORDER — PROPOFOL 10 MG/ML IV BOLUS
INTRAVENOUS | Status: AC
  Filled 2023-07-29: qty 20

## 2023-07-29 MED ORDER — SENNA 8.6 MG PO TABS: 1.0000 | ORAL_TABLET | Freq: Every day | ORAL | 0 refills | Status: AC

## 2023-07-29 MED ORDER — PHENYLEPHRINE HCL-NACL 20-0.9 MG/250ML-% IV SOLN
INTRAVENOUS | Status: AC
  Filled 2023-07-29: qty 250

## 2023-07-29 MED ORDER — ROCURONIUM BROMIDE 10 MG/ML (PF) SYRINGE
PREFILLED_SYRINGE | INTRAVENOUS | Status: AC
  Filled 2023-07-29: qty 10

## 2023-07-29 MED ORDER — SODIUM CHLORIDE 0.9 % IR SOLN
Status: DC | PRN
  Administered 2023-07-29: 1000 mL
  Administered 2023-07-29: 1000 mL via INTRAVESICAL

## 2023-07-29 MED ORDER — FLUORESCEIN SODIUM 10 % IV SOLN
INTRAVENOUS | Status: DC | PRN
  Administered 2023-07-29: 2.5 mL via INTRAVENOUS

## 2023-07-29 MED ORDER — OXYCODONE HCL 5 MG PO TABS
5.0000 mg | ORAL_TABLET | Freq: Once | ORAL | Status: AC
  Administered 2023-07-29: 5 mg via ORAL

## 2023-07-29 MED ORDER — HYDROMORPHONE HCL 1 MG/ML IJ SOLN
INTRAMUSCULAR | Status: AC
  Filled 2023-07-29: qty 1

## 2023-07-29 MED ORDER — CHLORHEXIDINE GLUCONATE 0.12 % MT SOLN
OROMUCOSAL | Status: AC
  Filled 2023-07-29: qty 15

## 2023-07-29 MED ORDER — POLYETHYLENE GLYCOL 3350 17 GM/SCOOP PO POWD: 17.0000 g | Freq: Every day | ORAL | 0 refills | Status: AC

## 2023-07-29 MED ORDER — PROPOFOL 10 MG/ML IV BOLUS
INTRAVENOUS | Status: DC | PRN
  Administered 2023-07-29: 200 mg via INTRAVENOUS

## 2023-07-29 MED ORDER — IBUPROFEN 800 MG PO TABS: 800.0000 mg | ORAL_TABLET | Freq: Three times a day (TID) | ORAL | 0 refills | Status: AC

## 2023-07-29 MED ORDER — MIDAZOLAM HCL 2 MG/2ML IJ SOLN
INTRAMUSCULAR | Status: DC | PRN
  Administered 2023-07-29: 2 mg via INTRAVENOUS

## 2023-07-29 MED ORDER — FENTANYL CITRATE (PF) 100 MCG/2ML IJ SOLN
INTRAMUSCULAR | Status: DC | PRN
  Administered 2023-07-29: 100 ug via INTRAVENOUS

## 2023-07-29 MED ORDER — HEPARIN SODIUM (PORCINE) 5000 UNIT/ML IJ SOLN
INTRAMUSCULAR | Status: AC
  Filled 2023-07-29: qty 1

## 2023-07-29 SURGICAL SUPPLY — 80 items
APPLICATOR SURGIFLO ENDO (HEMOSTASIS) ×1 IMPLANT
BAG URINE DRAIN 2000ML AR STRL (UROLOGICAL SUPPLIES) ×4 IMPLANT
BLADE SURG 15 STRL LF DISP TIS (BLADE) ×4 IMPLANT
CABLE HIGH FREQUENCY MONO STRZ (ELECTRODE) ×1 IMPLANT
CANNULA DILATOR 5 W/SLV (CANNULA) ×8 IMPLANT
CATH URTH 16FR FL 2W BLN LF (CATHETERS) ×4 IMPLANT
CNTNR URN SCR LID CUP LEK RST (MISCELLANEOUS) ×4 IMPLANT
CORD MONOPOLAR M/FML 12FT (MISCELLANEOUS) IMPLANT
DERMABOND ADVANCED .7 DNX12 (GAUZE/BANDAGES/DRESSINGS) ×4 IMPLANT
DEVICE SUTURE ENDOST 10MM (ENDOMECHANICALS) ×1 IMPLANT
DRAPE LAP W/FLUID (DRAPES) ×4 IMPLANT
DRAPE LAPAROSCOPIC ABDOMINAL (DRAPES) ×1 IMPLANT
DRAPE LAPAROTOMY 100X77 ABD (DRAPES) ×3 IMPLANT
DRAPE LAPAROTOMY TRNSV 106X77 (MISCELLANEOUS) ×3 IMPLANT
DRAPE PERI LITHO V/GYN (MISCELLANEOUS) ×3 IMPLANT
DRAPE STERI POUCH LG 24X46 STR (DRAPES) ×8 IMPLANT
DRAPE UNDER BUTTOCK W/FLU (DRAPES) ×4 IMPLANT
DRSG TELFA 3X8 NADH STRL (GAUZE/BANDAGES/DRESSINGS) ×4 IMPLANT
ELECT BLADE 6 FLAT ULTRCLN (ELECTRODE) ×3 IMPLANT
ELECT CAUTERY BLADE 6.4 (BLADE) ×3 IMPLANT
ELECT REM PT RETURN 9FT ADLT (ELECTROSURGICAL) ×4 IMPLANT
ELECTRODE REM PT RTRN 9FT ADLT (ELECTROSURGICAL) ×4 IMPLANT
GAUZE 4X4 16PLY ~~LOC~~+RFID DBL (SPONGE) ×4 IMPLANT
GAUZE SPONGE 4X4 12PLY STRL (GAUZE/BANDAGES/DRESSINGS) ×4 IMPLANT
GLOVE BIO SURGEON STRL SZ8 (GLOVE) ×16 IMPLANT
GLOVE INDICATOR 8.0 STRL GRN (GLOVE) ×4 IMPLANT
GOWN STRL REUS W/ TWL LRG LVL3 (GOWN DISPOSABLE) ×8 IMPLANT
GOWN STRL REUS W/ TWL XL LVL3 (GOWN DISPOSABLE) ×12 IMPLANT
GRASPER SUT TROCAR 14GX15 (MISCELLANEOUS) ×4 IMPLANT
HANDLE YANKAUER SUCT BULB TIP (MISCELLANEOUS) ×1 IMPLANT
IRRIGATION STRYKERFLOW (MISCELLANEOUS) ×1 IMPLANT
IRRIGATOR STRYKERFLOW (MISCELLANEOUS) ×4 IMPLANT
IV NS 1000ML BAXH (IV SOLUTION) ×1 IMPLANT
KIT IMAGING PINPOINTPAQ (MISCELLANEOUS) ×1 IMPLANT
KIT PINK PAD W/HEAD ARE REST (MISCELLANEOUS) ×4 IMPLANT
KIT PINK PAD W/HEAD ARM REST (MISCELLANEOUS) ×4 IMPLANT
KIT TURNOVER CYSTO (KITS) ×4 IMPLANT
LABEL OR SOLS (LABEL) ×4 IMPLANT
LIGASURE IMPACT 36 18CM CVD LR (INSTRUMENTS) IMPLANT
LIGASURE VESSEL 5MM BLUNT TIP (ELECTROSURGICAL) ×1 IMPLANT
MANIFOLD NEPTUNE II (INSTRUMENTS) ×4 IMPLANT
MANIPULATOR VCARE LG CRV RETR (MISCELLANEOUS) IMPLANT
MANIPULATOR VCARE SML CRV RETR (MISCELLANEOUS) IMPLANT
MANIPULATOR VCARE STD CRV RETR (MISCELLANEOUS) ×1 IMPLANT
NDL INSUFF ACCESS 14 VERSASTEP (NEEDLE) ×4 IMPLANT
NDL SPNL 22GX3.5 QUINCKE BK (NEEDLE) IMPLANT
NEEDLE SPNL 22GX3.5 QUINCKE BK (NEEDLE) ×4 IMPLANT
NS IRRIG 1000ML POUR BTL (IV SOLUTION) ×3 IMPLANT
NS IRRIG 500ML POUR BTL (IV SOLUTION) ×4 IMPLANT
OCCLUDER COLPOPNEUMO (BALLOONS) ×4 IMPLANT
PACK BASIN MAJOR ARMC (MISCELLANEOUS) ×3 IMPLANT
PACK GYN LAPAROSCOPIC (MISCELLANEOUS) ×4 IMPLANT
PAD OB MATERNITY 11 LF (PERSONAL CARE ITEMS) ×4 IMPLANT
PAD PREP OB/GYN DISP 24X41 (PERSONAL CARE ITEMS) ×4 IMPLANT
SCISSORS LAP 5X35 DISP (ENDOMECHANICALS) ×4 IMPLANT
SET CYSTO W/LG BORE CLAMP LF (SET/KITS/TRAYS/PACK) ×1 IMPLANT
SET TUBE SMOKE EVAC HIGH FLOW (TUBING) ×4 IMPLANT
SPONGE T-LAP 18X18 ~~LOC~~+RFID (SPONGE) ×10 IMPLANT
SPONGE T-LAP 4X18 ~~LOC~~+RFID (SPONGE) IMPLANT
STAPLER SKIN PROX 35W (STAPLE) ×3 IMPLANT
SURGIFLO W/THROMBIN 8M KIT (HEMOSTASIS) ×1 IMPLANT
SUT ENDO VLOC 180-0-8IN (SUTURE) ×1 IMPLANT
SUT MAXON ABS #0 GS21 30IN (SUTURE) ×6 IMPLANT
SUT MNCRL 4-0 27 PS-2 XMFL (SUTURE) ×8 IMPLANT
SUT PDS AB 1 TP1 96 (SUTURE) ×6 IMPLANT
SUT VIC AB 0 CT1 27XCR 8 STRN (SUTURE) ×12 IMPLANT
SUT VIC AB 0 CT1 36 (SUTURE) ×4 IMPLANT
SUT VIC AB 2-0 CT1 (SUTURE) ×3 IMPLANT
SUT VIC AB 2-0 SH 27XBRD (SUTURE) ×1 IMPLANT
SUT VICRYL 0 UR6 27IN ABS (SUTURE) ×4 IMPLANT
SUT VICRYL AB 3-0 FS1 BRD 27IN (SUTURE) ×3 IMPLANT
SUTURE MNCRL 4-0 27XMF (SUTURE) ×5 IMPLANT
SYR 10ML LL (SYRINGE) ×4 IMPLANT
SYR 50ML LL SCALE MARK (SYRINGE) ×4 IMPLANT
SYR BULB IRRIG 60ML STRL (SYRINGE) ×3 IMPLANT
TRAP FLUID SMOKE EVACUATOR (MISCELLANEOUS) ×3 IMPLANT
TROCAR BLUNT TIP 12MM OMST12BT (TROCAR) ×4 IMPLANT
TROCAR VERSASTEP PLUS 12MM (TROCAR) ×4 IMPLANT
TUBING CONNECTING 10 (TUBING) ×1 IMPLANT
WATER STERILE IRR 500ML POUR (IV SOLUTION) ×3 IMPLANT

## 2023-07-29 NOTE — Anesthesia Preprocedure Evaluation (Signed)
 Anesthesia Evaluation  Patient identified by MRN, date of birth, ID band Patient awake    Reviewed: Allergy & Precautions, H&P , NPO status , Patient's Chart, lab work & pertinent test results, reviewed documented beta blocker date and time   History of Anesthesia Complications Negative for: history of anesthetic complications  Airway Mallampati: III  TM Distance: >3 FB Neck ROM: full    Dental  (+) Dental Advidsory Given, Teeth Intact   Pulmonary neg pulmonary ROS   Pulmonary exam normal breath sounds clear to auscultation       Cardiovascular Exercise Tolerance: Good hypertension, (-) angina (-) Past MI and (-) Cardiac Stents + dysrhythmias Atrial Fibrillation + Valvular Problems/Murmurs  Rhythm:regular Rate:Normal     Neuro/Psych negative neurological ROS  negative psych ROS   GI/Hepatic Neg liver ROS, PUD,GERD  ,,  Endo/Other  diabetes (borderline)  Class 4 obesity  Renal/GU negative Renal ROS  negative genitourinary   Musculoskeletal   Abdominal   Peds  Hematology  (+) Blood dyscrasia, anemia   Anesthesia Other Findings Past Medical History: No date: Anemia 02/16/2019: Atrial fibrillation (HCC)     Comment:  new onset No date: Chronic ulcerative colitis (HCC) 06/2023: Endometrial adenocarcinoma (HCC) No date: Essential hypertension No date: GERD (gastroesophageal reflux disease) No date: Heart murmur No date: Morbid obesity with BMI of 50.0-59.9, adult (HCC) No date: Obesity, unspecified No date: Other abnormal glucose No date: Paroxysmal atrial fibrillation (HCC) No date: Postmenopausal bleeding No date: Pre-diabetes No date: Right ovarian cyst   Reproductive/Obstetrics negative OB ROS                             Anesthesia Physical Anesthesia Plan  ASA: 3  Anesthesia Plan: General   Post-op Pain Management:    Induction: Intravenous  PONV Risk Score and Plan: 3  and Ondansetron, Dexamethasone, Midazolam and Treatment may vary due to age or medical condition  Airway Management Planned: Oral ETT  Additional Equipment:   Intra-op Plan:   Post-operative Plan: Extubation in OR  Informed Consent: I have reviewed the patients History and Physical, chart, labs and discussed the procedure including the risks, benefits and alternatives for the proposed anesthesia with the patient or authorized representative who has indicated his/her understanding and acceptance.     Dental Advisory Given  Plan Discussed with: Anesthesiologist, CRNA and Surgeon  Anesthesia Plan Comments:         Anesthesia Quick Evaluation

## 2023-07-29 NOTE — Discharge Instructions (Signed)
 Maintain pelvic rest until post op visit

## 2023-07-29 NOTE — Anesthesia Procedure Notes (Signed)
 Procedure Name: Intubation Date/Time: 07/29/2023 7:51 AM  Performed by: Ginger Carne, CRNAPre-anesthesia Checklist: Patient identified, Emergency Drugs available, Suction available, Patient being monitored and Timeout performed Patient Re-evaluated:Patient Re-evaluated prior to induction Oxygen Delivery Method: Circle system utilized Preoxygenation: Pre-oxygenation with 100% oxygen Induction Type: IV induction and Rapid sequence Laryngoscope Size: McGrath and 3 Grade View: Grade I Tube type: Oral Tube size: 7.0 mm Number of attempts: 1 Airway Equipment and Method: Stylet and Video-laryngoscopy Placement Confirmation: ETT inserted through vocal cords under direct vision, positive ETCO2 and breath sounds checked- equal and bilateral Secured at: 22 cm Tube secured with: Tape Dental Injury: Teeth and Oropharynx as per pre-operative assessment

## 2023-07-29 NOTE — H&P (View-Only) (Signed)
 Gynecologic Oncology H&P   Referring Provider: Dr Erma Pinto Schermerhorn   Chief Complaint: Grade 1 endometrial adenocarcinoma   Subjective:  Rebecca Compton is a 53 y.o. P2 female who is seen in consultation from Dr. Feliberto Gottron for newly diagnosed endometrial adenocarcinoma.    Patient has history of obseity, htn, hld, uc, prediabetic, intermittent pAfib on diltiazem, who presented to gynecology for PMB. No history of HRT since menopause age 23.  NSVD x 2.   Pelvic ultrasound 07/07/23 11x7x5cm uterus, fluid filled endometrium measuring 6.79mm vs 10.24mm (including fluid), 2.5cm complex R ovarian cyst    Pap History: 07/19/15 NILM with neg HRHPV 04/24/21 NILM  06/24/23 AGC with neg HRHPV    EMB, colpo with biopsies and ECC on 07/07/23 Cervix looked normal. Part A-Cervical Biopsy,3:00- FRAGMENTS OF ADENOCARCINOMA, CONSISTENT WITH ENDOMETRIOID ADENOCARCINOMA (FIGO I) WITH SQUAMOUS METAPLASIA.   Part B-Cervical Biopsy,6:00- BENIGN ECTOCERVICAL TISSUE. NEGATIVE FOR DYSPLASIA AND MALIGNANCY. TRANSFORMATION ZONE NOT REPRESENTED.   Part C-Cervical Biopsy,9:00- MINUTE FRAGMENTS OF BENIGN ENDOCERVICAL GLANDS, SQUAMOUS EPITHELIUM, BLOOD, AND MUCUS. EXTREMELY SCANTY. NO DYSPLASIA.   Part D-Cervical Biopsy,10:00- BENIGN ECTOCERVICAL TISSUE, MIXED WITH FRAGMENTS OF ADENOCARCINOMA, CONSISTENT WITH ENDOMETRIOID ADENOCARCINOMA (FIGO I).   Part E-Cervical Biopsy,12:00- MILD CHRONIC CERVICITIS AND FRAGMENTS OF  ADENOCARCINOMA WITH SQUAMOUS METAPLASIA, CONSISTENT WITH  ENDOMETRIOID ADENOCARCINOMA (FIGO I).  Part F-cervical tissue/clot ,Cervical Biopsy- FRAGMENTS OF ADENOCARCINOMA, WITH SQUAMOUS METAPLASIA, CONSISTENT WITH ENDOMETRIOID ADENOCARCINOMA (FIGO I), MIXED WITH MINUTE FRAGMENTS OF BENIGN  ENDOCERVICAL GLANDS, SQUAMOUS EPITHELIUM, BLOOD, AND MUCUS.  Part G-Endometrial Biopsy - ENDOMETRIOID ADENOCARCINOMA (FIGO I) WITH SQUAMOUS METAPLASIA.   Part H-Endocervical Curettings- FRAGMENTS OF ADENOCARCINOMA,  WITH SQUAMOUS METAPLASIA, CONSISTENT WITH ENDOMETRIOID ADENOCARCINOMA (FIGO I).    Scheduled for CT scan later this week.      Problem List:     Patient Active Problem List    Diagnosis Date Noted   Atrial fibrillation (HCC) 07/15/2023   Heart murmur 02/03/2013   Essential hypertension        Past Medical History:     Past Medical History:  Diagnosis Date   Atrial fibrillation (HCC)     Heart murmur     Obesity, unspecified     Other abnormal glucose     Ulcerative colitis, unspecified     Unspecified essential hypertension            Past Surgical History:      Past Surgical History:  Procedure Laterality Date   DILATION AND CURETTAGE OF UTERUS       TUBAL LIGATION              Family History:      Family History  Problem Relation Age of Onset   Hypertension Father            Social History: Social History         Socioeconomic History   Marital status: Widowed      Spouse name: Not on file   Number of children: Not on file   Years of education: Not on file   Highest education level: Not on file  Occupational History   Not on file  Tobacco Use   Smoking status: Never   Smokeless tobacco: Never  Vaping Use   Vaping status: Never Used  Substance and Sexual Activity   Alcohol use: No   Drug use: No   Sexual activity: Yes  Other Topics Concern   Not on file  Social History Narrative   Not on file    Social  Drivers of Acupuncturist Strain: Low Risk  (06/24/2023)    Received from [facility]    Overall Financial Resource Strain (CARDIA)     Difficulty of Paying Living Expenses: Not hard at all  Food Insecurity: No Food Insecurity (06/24/2023)    Received from [facility]    Hunger Vital Sign     Worried About Running Out of Food in the Last Year: Never true     Ran Out of Food in the Last Year: Never true  Transportation Needs: No Transportation Needs (06/24/2023)    Received from [facility] - Transportation     In the past 12 months, has lack of transportation kept you from medical appointments or from getting medications?: No     Lack of Transportation (Non-Medical): No  Physical Activity: Not on file  Stress: Not on file  Social Connections: Not on file  Intimate Partner Violence: Not on file      Allergies: Allergies       Allergies  Allergen Reactions   Hydrochlorothiazide Other (See Comments)      Chest Pain        Current Medications:       Current Outpatient Medications  Medication Sig Dispense Refill   chlorthalidone (HYGROTON) 25 MG tablet Take 25 mg by mouth daily.       diltiazem (CARDIZEM CD) 120 MG 24 hr capsule Take 1 capsule (120 mg total) by mouth daily. (Patient taking differently: Take 240 mg by mouth daily.) 30 capsule 0   diltiazem (CARDIZEM) 30 MG tablet Take 1 tablet (30 mg total) by mouth daily as needed (Take as needed for atrial fibrillation and heart rate above 110 that resists your long-acting medicine). 30 tablet 0   escitalopram (LEXAPRO) 10 MG tablet Take 10 mg by mouth daily.       lisinopril (PRINIVIL,ZESTRIL) 40 MG tablet Take 40 mg by mouth daily.       pantoprazole (PROTONIX) 40 MG tablet Take 40 mg by mouth daily.       acetaminophen (TYLENOL) 500 MG tablet Take 500-1,000 mg by mouth every 6 (six) hours as needed for mild pain or fever.  (Patient not taking: Reported on 07/15/2023)       amLODipine (NORVASC) 10 MG tablet Take 10 mg by mouth daily. (Patient not taking: Reported on 07/15/2023)       benzonatate (TESSALON PERLES) 100 MG capsule Take 1 capsule (100 mg total) by mouth every 6 (six) hours as needed for cough. (Patient not taking: Reported on 07/15/2023) 20 capsule 0   meloxicam (MOBIC) 15 MG tablet Take 15 mg by mouth daily as needed for pain. (Patient not taking: Reported on 07/15/2023)       ondansetron (ZOFRAN ODT) 4 MG disintegrating tablet Take 1 tablet (4 mg total) by mouth every 6  (six) hours as needed for nausea or vomiting. (Patient not taking: Reported on 07/15/2023) 20 tablet 0      No current facility-administered medications for this visit.        General: negative for fevers, changes in weight or night sweats Skin: negative for changes in moles or sores or rash Eyes: negative for changes in vision HEENT: negative for change in hearing, tinnitus, voice changes Pulmonary: negative for dyspnea, orthopnea, productive cough, wheezing Cardiac: negative for palpitations, pain Gastrointestinal: negative for nausea, vomiting, constipation, diarrhea, hematemesis, hematochezia Genitourinary/Sexual: negative for  dysuria, retention, hematuria, incontinence Ob/Gyn:  per HPI Musculoskeletal: negative for pain, joint pain, back pain Hematology: negative for easy bruising, abnormal bleeding Neurologic/Psych: negative for headaches, seizures, paralysis, weakness, numbness     Objective:  Physical Examination:  Pulse 79   Temp (!) 97 F (36.1 C) (Tympanic)   Resp 20   Wt (!) 303 lb (137.4 kg)   BMI 50.42 kg/m     ECOG Performance Status: 1 - Symptomatic but completely ambulatory   General appearance: alert, cooperative, and appears stated age HEENT: mucus membranes moist no mucosal lesions posterior pharynx benign PERRL EOMI sclera clear Neck: no thyroid enlargement or cervical adenopathy Lymph node survey: non-palpable, axillary, inguinal, supraclavicular Cardiovascular: without murmurs, rubs or gallops Respiratory: clear to auscultation Abdomen: no palpable masses, no hernias, well healed incision, soft, nontender, nondistended, bowel sounds present, and without hepatosplenomegaly Back: inspection of back is normal Extremities: no lower extremity edema Skin exam - normal coloration and turgor, no rashes, no suspicious skin lesions noted. Neurological exam reveals alert, oriented, normal speech, no focal findings or movement disorder noted.   Pelvic: Exam  chaperoned by RN EGBUS: no lesions Cervix: no lesions, nontender, mobile Vagina: no lesions, no discharge or bleeding Uterus: normal size, nontender, mobile Adnexa: no palpable masses Rectovaginal: confirmatory   Lab Review Labs on site today: Recent Labs       Lab Results  Component Value Date    WBC 8.0 04/28/2023    HGB 12.5 04/28/2023    HCT 39.3 04/28/2023    MCV 76.9 (L) 04/28/2023    PLT 424 (H) 04/28/2023        Chemistry    Labs (Brief)          Component Value Date/Time    NA 136 04/28/2023 1007    NA 140 01/04/2014 1214    K 3.9 04/28/2023 1007    K 3.9 01/04/2014 1214    CL 100 04/28/2023 1007    CL 105 01/04/2014 1214    CO2 25 04/28/2023 1007    CO2 27 01/04/2014 1214    BUN 16 04/28/2023 1007    BUN 10 01/04/2014 1214    CREATININE 1.02 (H) 04/28/2023 1007    CREATININE 0.93 01/04/2014 1214     Labs (Brief)          Component Value Date/Time    CALCIUM 9.7 04/28/2023 1007    CALCIUM 9.0 01/04/2014 1214    ALKPHOS 55 02/16/2019 1017    AST 18 02/16/2019 1017    ALT 13 02/16/2019 1017    BILITOT 0.8 02/16/2019 1017        05/27/23 - outside labs HgbA1c 6.3, vitamin B12 = 237 (low), normal TFTs and TSH   CBC w/auto Differential (5 Part) Order: [id number] Component Ref Range & Units 1 mo ago  WBC (White Blood Cell Count) 4.1 - 10.2 10^3/uL 8.5  RBC (Red Blood Cell Count) 4.04 - 5.48 10^6/uL 4.59  Hemoglobin 12.0 - 15.0 gm/dL 65.7 Low   Hematocrit 84.6 - 47.0 % 36  MCV (Mean Corpuscular Volume) 80.0 - 100.0 fl 78.4 Low   MCH (Mean Corpuscular Hemoglobin) 27.0 - 31.2 pg 25.5 Low   MCHC (Mean Corpuscular Hemoglobin Concentration) 32.0 - 36.0 gm/dL 96.2  Platelet Count 952 - 450 10^3/uL 388  RDW-CV (Red Cell Distribution Width) 11.6 - 14.8 % 16.1 High   MPV (Mean Platelet Volume) 9.4 - 12.4 fl 9.3 Low   Neutrophils 1.50 - 7.80 10^3/uL 5.66  Lymphocytes 1.00 -  3.60 10^3/uL 2.23  Monocytes 0.00 - 1.50 10^3/uL 0.47   Eosinophils 0.00 - 0.55 10^3/uL 0.11  Basophils 0.00 - 0.09 10^3/uL 0.05  Neutrophil % 32.0 - 70.0 % 66.3  Lymphocyte % 10.0 - 50.0 % 26.1  Monocyte % 4.0 - 13.0 % 5.5  Eosinophil % 1.0 - 5.0 % 1.3  Basophil% 0.0 - 2.0 % 0.6  Immature Granulocyte % <=0.7 % 0.2  Immature Granulocyte Count <=0.06 10^3/L 0.02  Resulting Agency [facility]      Comprehensive Metabolic Panel (CMP) Order: [id number] Component Ref Range & Units 1 mo ago  Glucose 70 - 110 mg/dL 90  Sodium 409 - 811 mmol/L 137  Potassium 3.6 - 5.1 mmol/L 4.1  Chloride 97 - 109 mmol/L 99  Carbon Dioxide (CO2) 22.0 - 32.0 mmol/L 28  Urea Nitrogen (BUN) 7 - 25 mg/dL 18  Creatinine 0.6 - 1.1 mg/dL 1  Glomerular Filtration Rate (eGFR) >60 mL/min/1.73sq m 67  Comment: CKD-EPI (2021) does not include patient's race in the calculation of eGFR.  Monitoring changes of plasma creatinine and eGFR over time is useful for monitoring kidney function.  Interpretive Ranges for eGFR (CKD-EPI 2021):  eGFR:       >60 mL/min/1.73 sq. m - Normal eGFR:       30-59 mL/min/1.73 sq. m - Moderately Decreased eGFR:       15-29 mL/min/1.73 sq. m  - Severely Decreased eGFR:       < 15 mL/min/1.73 sq. m  - Kidney Failure   Note: These eGFR calculations do not apply in acute situations when eGFR is changing rapidly or patients on dialysis.  Calcium 8.7 - 10.3 mg/dL 9.9  AST 8 - 39 U/L 12  ALT 5 - 38 U/L 12  Alk Phos (alkaline Phosphatase) 34 - 104 U/L 71  Albumin 3.5 - 4.8 g/dL 4.3  Bilirubin, Total 0.3 - 1.2 mg/dL 0.8  Protein, Total 6.1 - 7.9 g/dL 7.4  A/G Ratio 1.0 - 5.0 gm/dL 1.4  Resulting Agency [facility]    Specimen Collected: 05/27/23 16:40    Performed by: [facility] Last Resulted: 05/27/23 17:55      Assessment:  Rebecca Compton is a 53 y.o. female with PMB and PAP showing atypical glandular cells.  US showed endometrial fluid, thickened endometrial stripe and  2.5cm complex R ovarian cyst.  Endometrial biopsy showed grade 1 endometrial adenocarcinoma. Colposcopy with ECC and cervical biopsies done in view of abnormal PAP and showed fragments of grade 1 adenocarcinoma, but cervix appears normal.  Suspect that this is a primary endometrial cancer with spillage of tumor into the other biopsies.  She will have a CT scan to further rule out cervical involvement or other spread of cancer.    Mild anemia due to PMB and prediabetic.  Atrial fibrillation, but normal rhythm on diltiazem.   Medical co-morbidities complicating care: intermittent atrial fibrillation, HTN, BMI 50, prediabetic HgbA1c slightly elevated.  Plan:    Problem List Items Addressed This Visit              Genitourinary    Endometrial cancer (HCC) - Primary    We discussed options for management including TLH/BSO and SLN mapping and biopsies, possible pelvic/aortic LN biopsies and [facility]otomy at [facility] on 07/29/23.     The risks of surgery were discussed in detail and she understands these to include infection; wound separation; hernia; vaginal cuff separation, injury to adjacent organs  such as bowel, bladder, blood vessels, ureters and nerves; bleeding which may require blood transfusion; anesthesia risk; thromboembolic events; possible death; unforeseen complications; possible need for re-exploration; medical complications such as heart attack, stroke, pleural effusion and pneumonia; and, if staging performed the risk of lymphedema and lymphocyst.  The patient will receive DVT and antibiotic prophylaxis as indicated.  She voiced a clear understanding.  She had the opportunity to ask questions and written informed consent was obtained today.   Suggested return to clinic post op.     The patient's diagnosis, an outline of the further diagnostic and laboratory studies which will be required, the recommendation for surgery, and alternatives were discussed with her and her accompanying family  members.  All questions were answered to their satisfaction.   A total of 60 minutes were spent with the patient/family today; 50% was spent in education, counseling and coordination of care for endometrial cancer.     Alinda Dooms, NP   I personally interviewed and examined the patient. Agreed with the above/below plan of care. I have directly contributed to assessment and plan of care of this patient and educated and discussed with patient and family.  Leida Lauth, MD

## 2023-07-29 NOTE — Anesthesia Postprocedure Evaluation (Signed)
 Anesthesia Post Note  Patient: Rebecca Compton  Procedure(s) Performed: HYSTERECTOMY, TOTAL, LAPAROSCOPIC, WITH BILATERAL SALPINGO-OOPHORECTOMY; PELVIC WASHINGS (Abdomen) SENTINEL LYMPH NODE INJECTION AND MAPPING AND BIOPSIES (Abdomen) INDOCYANINE GREEN FLUORESCENCE IMAGING (ICG) (Cervix) CYSTOSCOPY (Bladder)  Patient location during evaluation: PACU Anesthesia Type: General Level of consciousness: awake and alert Pain management: pain level controlled Vital Signs Assessment: post-procedure vital signs reviewed and stable Respiratory status: spontaneous breathing, nonlabored ventilation and respiratory function stable Cardiovascular status: blood pressure returned to baseline and stable Postop Assessment: no apparent nausea or vomiting Anesthetic complications: no   No notable events documented.   Last Vitals:  Vitals:   07/29/23 1245 07/29/23 1322  BP: 114/64 113/70  Pulse: 73 69  Resp: 18 19  Temp:  36.5 C  SpO2: 97% 95%    Last Pain:  Vitals:   07/29/23 1322  TempSrc:   PainSc: 5                  Foye Deer

## 2023-07-29 NOTE — H&P (Signed)
 Gynecologic Oncology H&P   Referring Provider: Dr Erma Pinto Schermerhorn   Chief Complaint: Grade 1 endometrial adenocarcinoma   Subjective:  Rebecca Compton is a 53 y.o. P2 female who is seen in consultation from Dr. Feliberto Gottron for newly diagnosed endometrial adenocarcinoma.    Patient has history of obseity, htn, hld, uc, prediabetic, intermittent pAfib on diltiazem, who presented to gynecology for PMB. No history of HRT since menopause age 23.  NSVD x 2.   Pelvic ultrasound 07/07/23 11x7x5cm uterus, fluid filled endometrium measuring 6.79mm vs 10.24mm (including fluid), 2.5cm complex R ovarian cyst    Pap History: 07/19/15 NILM with neg HRHPV 04/24/21 NILM  06/24/23 AGC with neg HRHPV    EMB, colpo with biopsies and ECC on 07/07/23 Cervix looked normal. Part A-Cervical Biopsy,3:00- FRAGMENTS OF ADENOCARCINOMA, CONSISTENT WITH ENDOMETRIOID ADENOCARCINOMA (FIGO I) WITH SQUAMOUS METAPLASIA.   Part B-Cervical Biopsy,6:00- BENIGN ECTOCERVICAL TISSUE. NEGATIVE FOR DYSPLASIA AND MALIGNANCY. TRANSFORMATION ZONE NOT REPRESENTED.   Part C-Cervical Biopsy,9:00- MINUTE FRAGMENTS OF BENIGN ENDOCERVICAL GLANDS, SQUAMOUS EPITHELIUM, BLOOD, AND MUCUS. EXTREMELY SCANTY. NO DYSPLASIA.   Part D-Cervical Biopsy,10:00- BENIGN ECTOCERVICAL TISSUE, MIXED WITH FRAGMENTS OF ADENOCARCINOMA, CONSISTENT WITH ENDOMETRIOID ADENOCARCINOMA (FIGO I).   Part E-Cervical Biopsy,12:00- MILD CHRONIC CERVICITIS AND FRAGMENTS OF  ADENOCARCINOMA WITH SQUAMOUS METAPLASIA, CONSISTENT WITH  ENDOMETRIOID ADENOCARCINOMA (FIGO I).  Part F-cervical tissue/clot ,Cervical Biopsy- FRAGMENTS OF ADENOCARCINOMA, WITH SQUAMOUS METAPLASIA, CONSISTENT WITH ENDOMETRIOID ADENOCARCINOMA (FIGO I), MIXED WITH MINUTE FRAGMENTS OF BENIGN  ENDOCERVICAL GLANDS, SQUAMOUS EPITHELIUM, BLOOD, AND MUCUS.  Part G-Endometrial Biopsy - ENDOMETRIOID ADENOCARCINOMA (FIGO I) WITH SQUAMOUS METAPLASIA.   Part H-Endocervical Curettings- FRAGMENTS OF ADENOCARCINOMA,  WITH SQUAMOUS METAPLASIA, CONSISTENT WITH ENDOMETRIOID ADENOCARCINOMA (FIGO I).    Scheduled for CT scan later this week.      Problem List:     Patient Active Problem List    Diagnosis Date Noted   Atrial fibrillation (HCC) 07/15/2023   Heart murmur 02/03/2013   Essential hypertension        Past Medical History:     Past Medical History:  Diagnosis Date   Atrial fibrillation (HCC)     Heart murmur     Obesity, unspecified     Other abnormal glucose     Ulcerative colitis, unspecified     Unspecified essential hypertension            Past Surgical History:      Past Surgical History:  Procedure Laterality Date   DILATION AND CURETTAGE OF UTERUS       TUBAL LIGATION              Family History:      Family History  Problem Relation Age of Onset   Hypertension Father            Social History: Social History         Socioeconomic History   Marital status: Widowed      Spouse name: Not on file   Number of children: Not on file   Years of education: Not on file   Highest education level: Not on file  Occupational History   Not on file  Tobacco Use   Smoking status: Never   Smokeless tobacco: Never  Vaping Use   Vaping status: Never Used  Substance and Sexual Activity   Alcohol use: No   Drug use: No   Sexual activity: Yes  Other Topics Concern   Not on file  Social History Narrative   Not on file    Social  Drivers of Acupuncturist Strain: Low Risk  (06/24/2023)    Received from [facility]    Overall Financial Resource Strain (CARDIA)     Difficulty of Paying Living Expenses: Not hard at all  Food Insecurity: No Food Insecurity (06/24/2023)    Received from [facility]    Hunger Vital Sign     Worried About Running Out of Food in the Last Year: Never true     Ran Out of Food in the Last Year: Never true  Transportation Needs: No Transportation Needs (06/24/2023)    Received from [facility] - Transportation     In the past 12 months, has lack of transportation kept you from medical appointments or from getting medications?: No     Lack of Transportation (Non-Medical): No  Physical Activity: Not on file  Stress: Not on file  Social Connections: Not on file  Intimate Partner Violence: Not on file      Allergies: Allergies       Allergies  Allergen Reactions   Hydrochlorothiazide Other (See Comments)      Chest Pain        Current Medications:       Current Outpatient Medications  Medication Sig Dispense Refill   chlorthalidone (HYGROTON) 25 MG tablet Take 25 mg by mouth daily.       diltiazem (CARDIZEM CD) 120 MG 24 hr capsule Take 1 capsule (120 mg total) by mouth daily. (Patient taking differently: Take 240 mg by mouth daily.) 30 capsule 0   diltiazem (CARDIZEM) 30 MG tablet Take 1 tablet (30 mg total) by mouth daily as needed (Take as needed for atrial fibrillation and heart rate above 110 that resists your long-acting medicine). 30 tablet 0   escitalopram (LEXAPRO) 10 MG tablet Take 10 mg by mouth daily.       lisinopril (PRINIVIL,ZESTRIL) 40 MG tablet Take 40 mg by mouth daily.       pantoprazole (PROTONIX) 40 MG tablet Take 40 mg by mouth daily.       acetaminophen (TYLENOL) 500 MG tablet Take 500-1,000 mg by mouth every 6 (six) hours as needed for mild pain or fever.  (Patient not taking: Reported on 07/15/2023)       amLODipine (NORVASC) 10 MG tablet Take 10 mg by mouth daily. (Patient not taking: Reported on 07/15/2023)       benzonatate (TESSALON PERLES) 100 MG capsule Take 1 capsule (100 mg total) by mouth every 6 (six) hours as needed for cough. (Patient not taking: Reported on 07/15/2023) 20 capsule 0   meloxicam (MOBIC) 15 MG tablet Take 15 mg by mouth daily as needed for pain. (Patient not taking: Reported on 07/15/2023)       ondansetron (ZOFRAN ODT) 4 MG disintegrating tablet Take 1 tablet (4 mg total) by mouth every 6  (six) hours as needed for nausea or vomiting. (Patient not taking: Reported on 07/15/2023) 20 tablet 0      No current facility-administered medications for this visit.        General: negative for fevers, changes in weight or night sweats Skin: negative for changes in moles or sores or rash Eyes: negative for changes in vision HEENT: negative for change in hearing, tinnitus, voice changes Pulmonary: negative for dyspnea, orthopnea, productive cough, wheezing Cardiac: negative for palpitations, pain Gastrointestinal: negative for nausea, vomiting, constipation, diarrhea, hematemesis, hematochezia Genitourinary/Sexual: negative for  dysuria, retention, hematuria, incontinence Ob/Gyn:  per HPI Musculoskeletal: negative for pain, joint pain, back pain Hematology: negative for easy bruising, abnormal bleeding Neurologic/Psych: negative for headaches, seizures, paralysis, weakness, numbness     Objective:  Physical Examination:  Pulse 79   Temp (!) 97 F (36.1 C) (Tympanic)   Resp 20   Wt (!) 303 lb (137.4 kg)   BMI 50.42 kg/m     ECOG Performance Status: 1 - Symptomatic but completely ambulatory   General appearance: alert, cooperative, and appears stated age HEENT: mucus membranes moist no mucosal lesions posterior pharynx benign PERRL EOMI sclera clear Neck: no thyroid enlargement or cervical adenopathy Lymph node survey: non-palpable, axillary, inguinal, supraclavicular Cardiovascular: without murmurs, rubs or gallops Respiratory: clear to auscultation Abdomen: no palpable masses, no hernias, well healed incision, soft, nontender, nondistended, bowel sounds present, and without hepatosplenomegaly Back: inspection of back is normal Extremities: no lower extremity edema Skin exam - normal coloration and turgor, no rashes, no suspicious skin lesions noted. Neurological exam reveals alert, oriented, normal speech, no focal findings or movement disorder noted.   Pelvic: Exam  chaperoned by RN EGBUS: no lesions Cervix: no lesions, nontender, mobile Vagina: no lesions, no discharge or bleeding Uterus: normal size, nontender, mobile Adnexa: no palpable masses Rectovaginal: confirmatory   Lab Review Labs on site today: Recent Labs       Lab Results  Component Value Date    WBC 8.0 04/28/2023    HGB 12.5 04/28/2023    HCT 39.3 04/28/2023    MCV 76.9 (L) 04/28/2023    PLT 424 (H) 04/28/2023        Chemistry    Labs (Brief)          Component Value Date/Time    NA 136 04/28/2023 1007    NA 140 01/04/2014 1214    K 3.9 04/28/2023 1007    K 3.9 01/04/2014 1214    CL 100 04/28/2023 1007    CL 105 01/04/2014 1214    CO2 25 04/28/2023 1007    CO2 27 01/04/2014 1214    BUN 16 04/28/2023 1007    BUN 10 01/04/2014 1214    CREATININE 1.02 (H) 04/28/2023 1007    CREATININE 0.93 01/04/2014 1214     Labs (Brief)          Component Value Date/Time    CALCIUM 9.7 04/28/2023 1007    CALCIUM 9.0 01/04/2014 1214    ALKPHOS 55 02/16/2019 1017    AST 18 02/16/2019 1017    ALT 13 02/16/2019 1017    BILITOT 0.8 02/16/2019 1017        05/27/23 - outside labs HgbA1c 6.3, vitamin B12 = 237 (low), normal TFTs and TSH   CBC w/auto Differential (5 Part) Order: [id number] Component Ref Range & Units 1 mo ago  WBC (White Blood Cell Count) 4.1 - 10.2 10^3/uL 8.5  RBC (Red Blood Cell Count) 4.04 - 5.48 10^6/uL 4.59  Hemoglobin 12.0 - 15.0 gm/dL 65.7 Low   Hematocrit 84.6 - 47.0 % 36  MCV (Mean Corpuscular Volume) 80.0 - 100.0 fl 78.4 Low   MCH (Mean Corpuscular Hemoglobin) 27.0 - 31.2 pg 25.5 Low   MCHC (Mean Corpuscular Hemoglobin Concentration) 32.0 - 36.0 gm/dL 96.2  Platelet Count 952 - 450 10^3/uL 388  RDW-CV (Red Cell Distribution Width) 11.6 - 14.8 % 16.1 High   MPV (Mean Platelet Volume) 9.4 - 12.4 fl 9.3 Low   Neutrophils 1.50 - 7.80 10^3/uL 5.66  Lymphocytes 1.00 -  3.60 10^3/uL 2.23  Monocytes 0.00 - 1.50 10^3/uL 0.47   Eosinophils 0.00 - 0.55 10^3/uL 0.11  Basophils 0.00 - 0.09 10^3/uL 0.05  Neutrophil % 32.0 - 70.0 % 66.3  Lymphocyte % 10.0 - 50.0 % 26.1  Monocyte % 4.0 - 13.0 % 5.5  Eosinophil % 1.0 - 5.0 % 1.3  Basophil% 0.0 - 2.0 % 0.6  Immature Granulocyte % <=0.7 % 0.2  Immature Granulocyte Count <=0.06 10^3/L 0.02  Resulting Agency [facility]      Comprehensive Metabolic Panel (CMP) Order: [id number] Component Ref Range & Units 1 mo ago  Glucose 70 - 110 mg/dL 90  Sodium 409 - 811 mmol/L 137  Potassium 3.6 - 5.1 mmol/L 4.1  Chloride 97 - 109 mmol/L 99  Carbon Dioxide (CO2) 22.0 - 32.0 mmol/L 28  Urea Nitrogen (BUN) 7 - 25 mg/dL 18  Creatinine 0.6 - 1.1 mg/dL 1  Glomerular Filtration Rate (eGFR) >60 mL/min/1.73sq m 67  Comment: CKD-EPI (2021) does not include patient's race in the calculation of eGFR.  Monitoring changes of plasma creatinine and eGFR over time is useful for monitoring kidney function.  Interpretive Ranges for eGFR (CKD-EPI 2021):  eGFR:       >60 mL/min/1.73 sq. m - Normal eGFR:       30-59 mL/min/1.73 sq. m - Moderately Decreased eGFR:       15-29 mL/min/1.73 sq. m  - Severely Decreased eGFR:       < 15 mL/min/1.73 sq. m  - Kidney Failure   Note: These eGFR calculations do not apply in acute situations when eGFR is changing rapidly or patients on dialysis.  Calcium 8.7 - 10.3 mg/dL 9.9  AST 8 - 39 U/L 12  ALT 5 - 38 U/L 12  Alk Phos (alkaline Phosphatase) 34 - 104 U/L 71  Albumin 3.5 - 4.8 g/dL 4.3  Bilirubin, Total 0.3 - 1.2 mg/dL 0.8  Protein, Total 6.1 - 7.9 g/dL 7.4  A/G Ratio 1.0 - 5.0 gm/dL 1.4  Resulting Agency [facility]    Specimen Collected: 05/27/23 16:40    Performed by: [facility] Last Resulted: 05/27/23 17:55      Assessment:  Rebecca Compton is a 53 y.o. female with PMB and PAP showing atypical glandular cells.  US showed endometrial fluid, thickened endometrial stripe and  2.5cm complex R ovarian cyst.  Endometrial biopsy showed grade 1 endometrial adenocarcinoma. Colposcopy with ECC and cervical biopsies done in view of abnormal PAP and showed fragments of grade 1 adenocarcinoma, but cervix appears normal.  Suspect that this is a primary endometrial cancer with spillage of tumor into the other biopsies.  She will have a CT scan to further rule out cervical involvement or other spread of cancer.    Mild anemia due to PMB and prediabetic.  Atrial fibrillation, but normal rhythm on diltiazem.   Medical co-morbidities complicating care: intermittent atrial fibrillation, HTN, BMI 50, prediabetic HgbA1c slightly elevated.  Plan:    Problem List Items Addressed This Visit              Genitourinary    Endometrial cancer (HCC) - Primary    We discussed options for management including TLH/BSO and SLN mapping and biopsies, possible pelvic/aortic LN biopsies and laparotomy at [facility] on 07/29/23.     The risks of surgery were discussed in detail and she understands these to include infection; wound separation; hernia; vaginal cuff separation, injury to adjacent organs  such as bowel, bladder, blood vessels, ureters and nerves; bleeding which may require blood transfusion; anesthesia risk; thromboembolic events; possible death; unforeseen complications; possible need for re-exploration; medical complications such as heart attack, stroke, pleural effusion and pneumonia; and, if staging performed the risk of lymphedema and lymphocyst.  The patient will receive DVT and antibiotic prophylaxis as indicated.  She voiced a clear understanding.  She had the opportunity to ask questions and written informed consent was obtained today.   Suggested return to clinic post op.     The patient's diagnosis, an outline of the further diagnostic and laboratory studies which will be required, the recommendation for surgery, and alternatives were discussed with her and her accompanying family  members.  All questions were answered to their satisfaction.   A total of 60 minutes were spent with the patient/family today; 50% was spent in education, counseling and coordination of care for endometrial cancer.     Alinda Dooms, NP   I personally interviewed and examined the patient. Agreed with the above/below plan of care. I have directly contributed to assessment and plan of care of this patient and educated and discussed with patient and family.  Leida Lauth, MD

## 2023-07-29 NOTE — Transfer of Care (Signed)
 Immediate Anesthesia Transfer of Care Note  Patient: Rebecca Compton  Procedure(s) Performed: HYSTERECTOMY, TOTAL, LAPAROSCOPIC, WITH BILATERAL SALPINGO-OOPHORECTOMY; PELVIC WASHINGS (Abdomen) SENTINEL LYMPH NODE INJECTION AND MAPPING AND BIOPSIES (Abdomen) INDOCYANINE GREEN FLUORESCENCE IMAGING (ICG) (Cervix) CYSTOSCOPY (Bladder)  Patient Location: PACU  Anesthesia Type:General  Level of Consciousness: awake, drowsy, and patient cooperative  Airway & Oxygen Therapy: Patient Spontanous Breathing and Patient connected to face mask oxygen  Post-op Assessment: Report given to RN and Post -op Vital signs reviewed and stable  Post vital signs: Reviewed and stable  Last Vitals:  Vitals Value Taken Time  BP 100/75 07/29/23 1238  Temp    Pulse 73 07/29/23 1242  Resp 15 07/29/23 1242  SpO2 97 % 07/29/23 1242  Vitals shown include unfiled device data.  Last Pain:  Vitals:   07/29/23 0627  TempSrc: Temporal  PainSc: 0-No pain         Complications: No notable events documented.

## 2023-07-29 NOTE — Interval H&P Note (Signed)
 History and Physical Interval Note:  07/29/2023 7:15 AM  Rebecca Compton  has presented today for surgery, with the diagnosis of Endometrial cancer.  The various methods of treatment have been discussed with the patient and family. After consideration of risks, benefits and other options for treatment, the patient has consented to  Procedure(s): HYSTERECTOMY, TOTAL, LAPAROSCOPIC, WITH BILATERAL SALPINGO-OOPHORECTOMY (N/A) SENTINEL LYMPH NODE INJECTION AND MAPPING AND BIOPSIES (N/A) INDOCYANINE GREEN FLUORESCENCE IMAGING (ICG) (N/A) LAPAROTOMY (N/A) LYMPHADENECTOMY, FOR CANCER STAGING (N/A) as a surgical intervention.  The patient's history has been reviewed, patient examined, no change in status, stable for surgery.  I have reviewed the patient's chart and labs.  Questions were answered to the patient's satisfaction.     Leida Lauth

## 2023-07-29 NOTE — Op Note (Signed)
 Operative Note   07/29/2023 3:00 PM  PRE-OP DIAGNOSIS: Endometrial cancer, (grade 1) morbid obesity (BMI 50)   POST-OP DIAGNOSIS: same  SURGEON: Surgeons and Role:    * Leida Lauth, MD - Primary    * Schermerhorn, Festus Holts, MD   ANESTHESIA: General   PROCEDURE: HYSTERECTOMY, TOTAL, LAPAROSCOPIC, WITH BILATERAL SALPINGO-OOPHORECTOMY; PELVIC WASHINGS, BILATERAL PELVIC SENTINEL LYMPH NODE INJECTION AND MAPPING AND BIOPSIES, INDOCYANINE GREEN FLUORESCENCE IMAGING (ICG), CYSTOSCOPY   ESTIMATED BLOOD LOSS: less than 100 mL  DRAINS: NONE   TOTAL IV FLUIDS: PER ANESTESIA  SPECIMENS: UTERUS, TUBES, OVARIES, BILATERAL SLNs   COMPLICATIONS: NONE  DISPOSITION: PACU - hemodynamically stable.  CONDITION: stable  INDICATIONS: Endometrial cancer, grade 1  FINDINGS: Normal cervix and adnexa.  Uterus mildly abnormal.  Upper abdomen normal, SLNs mapped to the external iliac area bilaterally.    PROCEDURE IN DETAIL: After informed consent was obtained, the patient was taken to the operating room where anesthesia was obtained without difficulty. The patient was positioned in the dorsal lithotomy position in [place] stirrups and her arms were carefully tucked at her sides and the usual precautions were taken.  She was prepped and draped in normal sterile fashion.  Time-out was performed and a Foley catheter was placed into the bladder and the cervix was infiltrated with 4 ml of ICG fluorescent dye at 3 an 9 o'clock both superficial and deep injections.   An open Hasson technique was used to place an infraumbilical 10-mm baloon trocar under direct visualization. The laparoscope was introduced and CO2 gas was infused for pneumoperitoneum to a pressure of 15 mm Hg.  Right and left lateral 5-mm ports and a 5-12 mm suprapubic port were placed under direct visualization of the laparoscope using an EndoStep technique.  Cytologic washings were obtained.  A standard VCare uterine manipulator was then  placed in the uterus under LS guidance with some difficulty due to long vagina.    The patient was placed in Trendelenburg and the bowel was displaced up into the upper abdomen.  Round ligaments were divided on each side with the EndoShears and the retroperitoneal space was opened bilaterally.  The ureters were identified and preserved.  At this point the retroperitoneal spaces were developed and the lymphatic channels were mapped to the external iliac area on each side.  The sentinel node on the right side was then identified, skeletonized and removed taking care not to injure the  obturator nerve, the ureter or the pelvic vasculature.  Similarly on the left side, the retroperitoneal spaces were developed, the lymphatic channels mapped to identify the sentinel node(s) and they were similarly removed with care to preserve the obturator nerve, the ureter and the pelvic vasculature. With hemostasis secured, the infundibulopelvic ligaments were skeletonized, sealed and divided with the LigaSure device.    A bladder flap was created and the bladder was dissected down off the lower uterine segment and cervix using endoshears and electrocautery.  The uterine arteries were skeletonized bilaterally, sealed and divided with the LigaSure device.  There was more bleeding than usual from the uterine vessels and uterine manipulation was not ideal due to her body habitus.  This required multiple applications of the Ligasure as we dropped the uterine vessels down to the level of  the the V-care cup.   A colpotomy was performed circumferentially along the V-Care ring with electrocautery and the cervix was incised from the vagina and the specimen was removed through the vagina.  A pneumo balloon was placed in the  vagina and the vaginal cuff was then closed in a running continuous fashion using the EndoStitch technique with 0 V-Lock suture with careful attention to include the vaginal cuff angles and the vaginal mucosa within  the closure.  There was some bleeding lateral to the left vaginal angle.  In view of this the right uterine artery was sealed laterally with the Ligasure after further opening the paravesicle space with attention to avoiding the ureter.  Hemostasis was then observed above, but there was bleeding in the right vaginal sulcus due to a laceration related to the extraction of the specimen.  Several 2-0 Vicryl sutures were placed to control this well.    A cystoscopy was performed after IV injection of 2.5 ml of fluorescein.  Efflux of urine was seen from both orifices with a 30 degree scope. The bladder wall was intact.   The intraperitoneal pressure was dropped, and all planes of dissection, vascular pedicles and the vaginal cuff were inspected.  There was some oozing from the bladder above the vaginal cuff and this was controlled with the endoshears using gentle cautery.  We placed 10 ml of surgi-flo on the vaginal cuff and pelvis for hemostasis.  The suprapubic trocar was removed and the fascia was closed with 0 Vicryl suture using the Endoclose technique. The lateral trocars were removed under visualization.   Before the umbilical trocar was removed the CO2 gas was released.  The fascia there was closed with 0 Vicryl suture in interrupted figure of eight technique.   The skin incisions were closed with subcuticular stitches and glue. Inspection of the vagina again revealed the cuff intact.  There was still some bleeding, so additional 2-0 Vicryl sutures were placed in the right vaginal sulcus and good hemostasis was noted.   The patient tolerated the procedure well.  Sponge, lap and needle counts were correct x2.  The patient was taken to recovery room in excellent condition.  An experienced assistant was required given the standard of surgical care given the complexity of the case. This assistant was needed for exposure, dissection, suctioning, retraction, instrument exchange, and for overall help during the  procedure.   Antibiotics: {Blank single:19197::"Given 1st or 2nd generation cephalosporin, Antibiotics given within 1 hour of the start of the procedure, Antibiotics ordered to be discontinued within 24 hours post procedure"  VTE prophylaxis: was ordered perioperatively with SQ heparin and IPCs.  Leida Lauth, MD

## 2023-07-30 ENCOUNTER — Encounter: Payer: Self-pay | Admitting: Obstetrics and Gynecology

## 2023-07-31 LAB — CYTOLOGY - NON PAP

## 2023-08-03 LAB — SURGICAL PATHOLOGY

## 2023-08-25 ENCOUNTER — Telehealth: Payer: Self-pay | Admitting: *Deleted

## 2023-08-25 NOTE — Telephone Encounter (Signed)
 This patient had surgery on 3/12 and she needs to fly tomorrow and she wants to know if she can fly

## 2023-09-09 ENCOUNTER — Inpatient Hospital Stay: Payer: 59 | Attending: Obstetrics and Gynecology | Admitting: Obstetrics and Gynecology

## 2023-09-09 ENCOUNTER — Encounter: Payer: Self-pay | Admitting: Obstetrics and Gynecology

## 2023-09-09 VITALS — BP 162/67 | HR 85 | Temp 97.9°F | Resp 19 | Wt 299.7 lb

## 2023-09-09 DIAGNOSIS — I10 Essential (primary) hypertension: Secondary | ICD-10-CM | POA: Insufficient documentation

## 2023-09-09 DIAGNOSIS — Z9079 Acquired absence of other genital organ(s): Secondary | ICD-10-CM

## 2023-09-09 DIAGNOSIS — C541 Malignant neoplasm of endometrium: Secondary | ICD-10-CM | POA: Insufficient documentation

## 2023-09-09 DIAGNOSIS — Z90710 Acquired absence of both cervix and uterus: Secondary | ICD-10-CM | POA: Diagnosis not present

## 2023-09-09 DIAGNOSIS — I4891 Unspecified atrial fibrillation: Secondary | ICD-10-CM | POA: Insufficient documentation

## 2023-09-09 DIAGNOSIS — Z7189 Other specified counseling: Secondary | ICD-10-CM

## 2023-09-09 DIAGNOSIS — Z90722 Acquired absence of ovaries, bilateral: Secondary | ICD-10-CM | POA: Insufficient documentation

## 2023-09-09 NOTE — Progress Notes (Signed)
 Gynecologic Oncology Consult Visit   Referring Provider: Dr Everlena Hoard  Chief Complaint: Grade 1 endometrial adenocarcinoma, post op check and treatment planning  Subjective:  Rebecca Compton is a 53 y.o. P2 female who is seen in consultation from Dr. [facility] for endometrial adenocarcinoma.   Here for post op check and treatment discussion.  Normal post op course.   TLH/BSO and SLN mapping and biopsies at [facility] on 07/29/23.    FINAL DIAGNOSIS       1. Lymph node, sentinel, biopsy, left external iliac :      - THREE LYMPH NODE NEGATIVE FOR MALIGNANCY (0/3).       2. Lymph node, sentinel, biopsy, right external iliac :      - THREE LYMPH NODE NEGATIVE FOR MALIGNANCY (0/3).       3. Uterus +/- tubes/ovaries, neoplastic, and ovaries :      - ENDOMETRIOID CARCINOMA.      - SEE CANCER SUMMARY BELOW.      - ENDOCERVIX WITH NABOTHIAN CYSTS.      - MYOMETRIUM WITH LEIOMYOMATA, LARGEST MEASURING 3.5 CM.      - BILATERAL FALLOPIAN TUBES WITH FIMBRIATED END; STATUS POST TUBAL LIGATION.      - BILATERAL OVARIES WITH SIMPLE CYSTS.      - PARATUBAL CYSTS.       Diagnosis Note : Since the carcinoma extensively involves the lower uterine      segment a limited panel of stains was performed.The carcinoma is positive for ER      and PR and demonstrates patchy p16 staining.This pattern of immunoreactivity      supports classification as endometrioid carcinoma.Pancytokeratin stains were      performed on two sentinel lymph nodes to evaluate two foci of cauterized      tissue.Stain controls worked appropriately.    TUMOR Histologic Type: Endometrioid carcinoma Histologic Grade: FIGO grade 1 Molecular Type: MMR Immunohistochemistry: Intact MMR protein expression p53 Immunohistochemistry: Normal (wild-type) expression     Myometrial Invasion: Present Depth of invasion (millimeters): 15 mm Myometrial thickness (millimeters): 17 mm Percentage of myometrial invasion: 88% Uterine  Serosa Involvement: Not identified Cervical Involvement: Cervical stromal invasion Percentage of Cervical Wall Involved: 67% Other Tissue/Organ Involvement: Not identified (other tissue/organ submitted in not involved) Pelvic washings: Malignant cells present Lymphatic and/or Vascular Invasion: Present Greater than or equal to 5 foci  MARGINS Margin Status: All margins negative for carcinoma ADDITIONAL FINDINGS: focal angiolymphatic invasion involving the right fallopian tube   REGIONAL LYMPH NODES Regional Lymph Node Status: All regional lymph nodes negative for tumor cells Lymph Nodes Examined:   Oncology history Patient has history of obseity, htn, hld, uc, prediabetic, intermittent pAfib on diltiazem , who presented to gynecology for PMB. No history of HRT since menopause age 80.  NSVD x 2.  Pelvic ultrasound 07/07/23 11x7x5cm uterus, fluid filled endometrium measuring 6.2mm vs 10.9mm (including fluid), 2.5cm complex R ovarian cyst   Pap History: 07/19/15 NILM with neg HRHPV 04/24/21 NILM  06/24/23 AGC with neg HRHPV   EMB, colpo with biopsies and ECC on 07/07/23 Cervix looked normal. Part A-Cervical Biopsy,3:00- FRAGMENTS OF ADENOCARCINOMA, CONSISTENT WITH ENDOMETRIOID ADENOCARCINOMA (FIGO I) WITH SQUAMOUS METAPLASIA.   Part B-Cervical Biopsy,6:00- BENIGN ECTOCERVICAL TISSUE. NEGATIVE FOR DYSPLASIA AND MALIGNANCY. TRANSFORMATION ZONE NOT REPRESENTED.   Part C-Cervical Biopsy,9:00- MINUTE FRAGMENTS OF BENIGN ENDOCERVICAL GLANDS, SQUAMOUS EPITHELIUM, BLOOD, AND MUCUS. EXTREMELY SCANTY. NO DYSPLASIA.   Part D-Cervical Biopsy,10:00- BENIGN ECTOCERVICAL TISSUE, MIXED WITH FRAGMENTS OF ADENOCARCINOMA, CONSISTENT WITH ENDOMETRIOID  ADENOCARCINOMA (FIGO I).   Part E-Cervical Biopsy,12:00- MILD CHRONIC CERVICITIS AND FRAGMENTS OF  ADENOCARCINOMA WITH SQUAMOUS METAPLASIA, CONSISTENT WITH  ENDOMETRIOID ADENOCARCINOMA (FIGO I).  Part F-cervical tissue/clot ,Cervical Biopsy- FRAGMENTS OF  ADENOCARCINOMA, WITH SQUAMOUS METAPLASIA, CONSISTENT WITH ENDOMETRIOID ADENOCARCINOMA (FIGO I), MIXED WITH MINUTE FRAGMENTS OF BENIGN  ENDOCERVICAL GLANDS, SQUAMOUS EPITHELIUM, BLOOD, AND MUCUS.  Part G-Endometrial Biopsy - ENDOMETRIOID ADENOCARCINOMA (FIGO I) WITH SQUAMOUS METAPLASIA.   Part H-Endocervical Curettings- FRAGMENTS OF ADENOCARCINOMA, WITH SQUAMOUS METAPLASIA, CONSISTENT WITH ENDOMETRIOID ADENOCARCINOMA (FIGO I).   CT scan 07/17/23  IMPRESSION: 1. Uterine heterogeneity with a 3.1 cm mass arising from the lower uterine segment, compatible with patient's known endometrial adenocarcinoma. 2. Fullness of the left-greater-than-right ovaries, nonspecific consider further evaluation with pelvic ultrasound. 3. No evidence of metastatic disease within the chest, abdomen or pelvis. 4. Patchy ground-glass opacity in the paramedian right lower lobe, likely infectious or inflammatory. 5. Esophageal wall thickening slightly anteriorly asymmetric in the distal esophagus, nonspecific but possibly reflecting esophagitis. Consider further evaluation with endoscopy. 6. Diffuse hepatic steatosis.     Problem List: Patient Active Problem List   Diagnosis Date Noted   Atrial fibrillation (HCC) 07/15/2023   Endometrial cancer (HCC) 07/15/2023   Heart murmur 02/03/2013   Essential hypertension     Past Medical History: Past Medical History:  Diagnosis Date   Anemia    Atrial fibrillation (HCC) 02/16/2019   new onset   Chronic ulcerative colitis (HCC)    Endometrial adenocarcinoma (HCC) 06/2023   Essential hypertension    GERD (gastroesophageal reflux disease)    Heart murmur    Morbid obesity with BMI of 50.0-59.9, adult (HCC)    Obesity, unspecified    Other abnormal glucose    Paroxysmal atrial fibrillation (HCC)    Postmenopausal bleeding    Pre-diabetes    Right ovarian cyst     Past Surgical History: Past Surgical History:  Procedure Laterality Date   COLONOSCOPY   09/23/2012   CYSTOSCOPY  07/29/2023   Procedure: CYSTOSCOPY;  Surgeon: Hermine Loots, MD;  Location: [facility] ORS;  Service: Gynecology;;   DILATION AND CURETTAGE OF UTERUS     SIGMOIDOSCOPY  09/18/1999   TOTAL LAPAROSCOPIC HYSTERECTOMY WITH BILATERAL SALPINGO OOPHORECTOMY N/A 07/29/2023   Procedure: HYSTERECTOMY, TOTAL, LAPAROSCOPIC, WITH BILATERAL SALPINGO-OOPHORECTOMY; PELVIC WASHINGS;  Surgeon: Hermine Loots, MD;  Location: [facility] ORS;  Service: Gynecology;  Laterality: N/A;   TUBAL LIGATION  1995    Family History: Family History  Problem Relation Age of Onset   Hypertension Father    Clotting disorder Maternal Uncle    Clotting disorder Paternal Grandfather     Social History: Social History   Socioeconomic History   Marital status: Widowed    Spouse name: Not on file   Number of children: 2   Years of education: Not on file   Highest education level: Not on file  Occupational History   Not on file  Tobacco Use   Smoking status: Never   Smokeless tobacco: Never  Vaping Use   Vaping status: Never Used  Substance and Sexual Activity   Alcohol use: No   Drug use: No   Sexual activity: Yes  Other Topics Concern   Not on file  Social History Narrative   Son lives with her   Social Drivers of Health   Financial Resource Strain: Low Risk  (06/24/2023)   Received from [facility]   Overall Financial Resource Strain (CARDIA)    Difficulty of Paying Living Expenses: Not hard  at all  Food Insecurity: No Food Insecurity (06/24/2023)   Received from [facility]   Hunger Vital Sign    Worried About Running Out of Food in the Last Year: Never true    Ran Out of Food in the Last Year: Never true  Transportation Needs: No Transportation Needs (06/24/2023)   Received from [facility] - Transportation    In the past 12 months, has lack of transportation kept you from medical appointments or from getting medications?:  No    Lack of Transportation (Non-Medical): No  Physical Activity: Not on file  Stress: Not on file  Social Connections: Not on file  Intimate Partner Violence: Not on file    Allergies: Allergies  Allergen Reactions   Hydrochlorothiazide Other (See Comments)    Chest Pain    Current Medications: Current Outpatient Medications  Medication Sig Dispense Refill   celecoxib (CELEBREX) 200 MG capsule Take 200 mg by mouth daily.     chlorthalidone (HYGROTON) 25 MG tablet Take 25 mg by mouth daily.     Cholecalciferol (VITAMIN D3 PO) Take 1,000 mcg by mouth daily.     diltiazem  (CARDIZEM  CD) 240 MG 24 hr capsule Take 240 mg by mouth daily.     diltiazem  (CARDIZEM ) 30 MG tablet Take 1 tablet (30 mg total) by mouth daily as needed (Take as needed for atrial fibrillation and heart rate above 110 that resists your long-acting medicine). 30 tablet 0   escitalopram (LEXAPRO) 10 MG tablet Take 10 mg by mouth daily.     lisinopril (PRINIVIL,ZESTRIL) 40 MG tablet Take 40 mg by mouth daily.     pantoprazole (PROTONIX) 40 MG tablet Take 40 mg by mouth daily.     No current facility-administered medications for this visit.    General: negative for fevers, changes in weight or night sweats Skin: negative for changes in moles or sores or rash Eyes: negative for changes in vision HEENT: negative for change in hearing, tinnitus, voice changes Pulmonary: negative for dyspnea, orthopnea, productive cough, wheezing Cardiac: negative for palpitations, pain Gastrointestinal: negative for nausea, vomiting, constipation, diarrhea, hematemesis, hematochezia Genitourinary/Sexual: negative for dysuria, retention, hematuria, incontinence Ob/Gyn:  per HPI Musculoskeletal: negative for pain, joint pain, back pain Hematology: negative for easy bruising, abnormal bleeding Neurologic/Psych: negative for headaches, seizures, paralysis, weakness, numbness  Objective:  Physical Examination:  Vitals:   09/09/23  1405  BP: (!) 162/67  Pulse: 85  Resp: 19  Temp: 97.9 F (36.6 C)  SpO2: 99%  Weight: 299 lb 11.2 oz (135.9 kg)      ECOG Performance Status: 0 - Asymptomatic  General appearance: alert, cooperative, and appears stated age HEENT: mucus membranes moist no mucosal lesions posterior pharynx benign PERRL EOMI sclera clear Neck: no thyroid  enlargement or cervical adenopathy Lymph node survey: non-palpable, axillary, inguinal, supraclavicular Cardiovascular: without murmurs, rubs or gallops Respiratory: clear to auscultation Abdomen: no palpable masses, no hernias, well healed incision, soft, nontender, nondistended, bowel sounds present, and without hepatosplenomegaly Incisions healing well.  Back: inspection of back is normal Extremities: no lower extremity edema Skin exam - normal coloration and turgor, no rashes, no suspicious skin lesions noted. Neurological exam reveals alert, oriented, normal speech, no focal findings or movement disorder noted.  Pelvic: Exam chaperoned by RN EGBUS: no lesions Vagina: cuff healing well Adnexa: no palpable masses Rectovaginal: confirmatory  Lab Review Labs on site today: Lab Results  Component Value Date   WBC 8.3 07/24/2023  HGB 11.9 (L) 07/24/2023   HCT 37.2 07/24/2023   MCV 79.3 (L) 07/24/2023   PLT 399 07/24/2023     Chemistry      Component Value Date/Time   NA 137 07/24/2023 0844   NA 140 01/04/2014 1214   K 3.7 07/24/2023 0844   K 3.9 01/04/2014 1214   CL 100 07/24/2023 0844   CL 105 01/04/2014 1214   CO2 23 07/24/2023 0844   CO2 27 01/04/2014 1214   BUN 26 (H) 07/24/2023 0844   BUN 10 01/04/2014 1214   CREATININE 0.97 07/24/2023 0844   CREATININE 0.93 01/04/2014 1214      Component Value Date/Time   CALCIUM 9.5 07/24/2023 0844   CALCIUM 9.0 01/04/2014 1214   ALKPHOS 59 07/24/2023 0844   AST 16 07/24/2023 0844   ALT 13 07/24/2023 0844   BILITOT 0.7 07/24/2023 0844      05/27/23 - outside labs HgbA1c 6.3,  vitamin B12 = 237 (low), normal TFTs and TSH   CBC w/auto Differential (5 Part) Order: [id number] Component Ref Range & Units 1 mo ago  WBC (White Blood Cell Count) 4.1 - 10.2 10^3/uL 8.5  RBC (Red Blood Cell Count) 4.04 - 5.48 10^6/uL 4.59  Hemoglobin 12.0 - 15.0 gm/dL 33.2 Low   Hematocrit 95.1 - 47.0 % 36  MCV (Mean Corpuscular Volume) 80.0 - 100.0 fl 78.4 Low   MCH (Mean Corpuscular Hemoglobin) 27.0 - 31.2 pg 25.5 Low   MCHC (Mean Corpuscular Hemoglobin Concentration) 32.0 - 36.0 gm/dL 88.4  Platelet Count 166 - 450 10^3/uL 388  RDW-CV (Red Cell Distribution Width) 11.6 - 14.8 % 16.1 High   MPV (Mean Platelet Volume) 9.4 - 12.4 fl 9.3 Low   Neutrophils 1.50 - 7.80 10^3/uL 5.66  Lymphocytes 1.00 - 3.60 10^3/uL 2.23  Monocytes 0.00 - 1.50 10^3/uL 0.47  Eosinophils 0.00 - 0.55 10^3/uL 0.11  Basophils 0.00 - 0.09 10^3/uL 0.05  Neutrophil % 32.0 - 70.0 % 66.3  Lymphocyte % 10.0 - 50.0 % 26.1  Monocyte % 4.0 - 13.0 % 5.5  Eosinophil % 1.0 - 5.0 % 1.3  Basophil% 0.0 - 2.0 % 0.6  Immature Granulocyte % <=0.7 % 0.2  Immature Granulocyte Count <=0.06 10^3/L 0.02  Resulting Agency [facility]    Comprehensive Metabolic Panel (CMP) Order: [id number] Component Ref Range & Units 1 mo ago  Glucose 70 - 110 mg/dL 90  Sodium 932 - 355 mmol/L 137  Potassium 3.6 - 5.1 mmol/L 4.1  Chloride 97 - 109 mmol/L 99  Carbon Dioxide (CO2) 22.0 - 32.0 mmol/L 28  Urea Nitrogen (BUN) 7 - 25 mg/dL 18  Creatinine 0.6 - 1.1 mg/dL 1  Glomerular Filtration Rate (eGFR) >60 mL/min/1.73sq m 67  Comment: CKD-EPI (2021) does not include patient's race in the calculation of eGFR.  Monitoring changes of plasma creatinine and eGFR over time is useful for monitoring kidney function.  Interpretive Ranges for eGFR (CKD-EPI 2021):  eGFR:       >60 mL/min/1.73 sq. m - Normal eGFR:       30-59 mL/min/1.73 sq. m - Moderately Decreased eGFR:       15-29 mL/min/1.73 sq. m   - Severely Decreased eGFR:       < 15 mL/min/1.73 sq. m  - Kidney Failure   Note: These eGFR calculations do not apply in acute situations when eGFR is changing rapidly or patients on dialysis.  Calcium 8.7 - 10.3 mg/dL 9.9  AST 8 - 39 U/L  12  ALT 5 - 38 U/L 12  Alk Phos (alkaline Phosphatase) 34 - 104 U/L 71  Albumin 3.5 - 4.8 g/dL 4.3  Bilirubin, Total 0.3 - 1.2 mg/dL 0.8  Protein, Total 6.1 - 7.9 g/dL 7.4  A/G Ratio 1.0 - 5.0 gm/dL 1.4  Resulting Agency [facility]   Specimen Collected: 05/27/23 16:40   Performed by: [facility] Last Resulted: 05/27/23 17:55     Assessment:  JASMYN PICHA is a 53 y.o. female with PMB and PAP showing atypical glandular cells.  US  showed endometrial fluid, thickened endometrial stripe and 2.5cm complex R ovarian cyst.  Endometrial biopsy showed grade 1 endometrial adenocarcinoma. Colposcopy with ECC and cervical biopsies done in view of abnormal PAP and showed fragments of grade 1 adenocarcinoma, but cervix appears normal.  CT scan 2/25 showed 3 cm lower uterine segment mass with no metastatic disease.  TLH/BSO and SLN mapping and biopsies at [facility] on 07/29/23.  Pathology showed 15/17 mm invasion of the myometrium and 2/3 cervical stromal invasion.  Positive LVSI and focal angiolymphatic invasion involving the right fallopian tube. Negative SLN.    TP53 WT, MMR IHC intact.   Atrial fibrillation, but normal rhythm on diltiazem .  Medical co-morbidities complicating care: intermittent atrial fibrillation, HTN, BMI 50, prediabetic HgbA1c slightly elevated.  Plan:   Problem List Items Addressed This Visit       Genitourinary   Endometrial cancer [facility])   Relevant Orders   Ambulatory referral to Radiation Oncology     Other   Counseling and coordination of care - Primary    In view of cervical stromal and deep myometrial invasion with LVSI we recommend pelvic radiation with vaginal brachytherapy.  Fortunately she  had negative SLNs, but we discussed that the intent of adjuvant radiation therapy is to reduce the 10-20% risk of local recurrence in the vagina and pelvis.  Will refer to Dr Jacalyn Martin in radiation oncology.    We discussed the need for surveillance visits post radiation and she will RTC in 3-4 months.     Hermine Loots, MD   CC:  [facility] [address] [place],  [place] [address] [phone]

## 2023-09-10 ENCOUNTER — Institutional Professional Consult (permissible substitution): Admitting: Radiation Oncology

## 2023-09-15 ENCOUNTER — Ambulatory Visit
Admission: RE | Admit: 2023-09-15 | Discharge: 2023-09-15 | Disposition: A | Discharge status: Home / Self Care | Source: Ambulatory Visit | Attending: Radiation Oncology | Admitting: Radiation Oncology

## 2023-09-15 ENCOUNTER — Encounter: Payer: Self-pay | Admitting: Radiation Oncology

## 2023-09-15 VITALS — BP 125/70 | HR 84 | Temp 97.0°F | Resp 16 | Wt 301.0 lb

## 2023-09-15 DIAGNOSIS — Z791 Long term (current) use of non-steroidal anti-inflammatories (NSAID): Secondary | ICD-10-CM | POA: Diagnosis not present

## 2023-09-15 DIAGNOSIS — I10 Essential (primary) hypertension: Secondary | ICD-10-CM | POA: Insufficient documentation

## 2023-09-15 DIAGNOSIS — I482 Chronic atrial fibrillation, unspecified: Secondary | ICD-10-CM | POA: Diagnosis not present

## 2023-09-15 DIAGNOSIS — K219 Gastro-esophageal reflux disease without esophagitis: Secondary | ICD-10-CM | POA: Diagnosis not present

## 2023-09-15 DIAGNOSIS — Z79899 Other long term (current) drug therapy: Secondary | ICD-10-CM | POA: Insufficient documentation

## 2023-09-15 DIAGNOSIS — C541 Malignant neoplasm of endometrium: Secondary | ICD-10-CM | POA: Diagnosis present

## 2023-09-15 DIAGNOSIS — K5190 Ulcerative colitis, unspecified, without complications: Secondary | ICD-10-CM | POA: Insufficient documentation

## 2023-09-15 NOTE — Consult Note (Signed)
 NEW PATIENT EVALUATION  Name: Rebecca Compton  MRN: [id number]  Date:   09/15/2023     DOB: 11-15-1970   This 53 y.o. female patient presents to the clinic for initial evaluation of FIGO grade 1 pathologic stage II (pT2 N0 M0) endometrial carcinoma status post TLH/BSO and SLN mapping.  REFERRING PHYSICIAN: [facility]  CHIEF COMPLAINT:  Chief Complaint  Patient presents with   endometrial cancer    DIAGNOSIS: The encounter diagnosis was Endometrial cancer (HCC).   PREVIOUS INVESTIGATIONS:  CT scans reviewed Pathology reports reviewed Clinical notes reviewed  HPI: Patient is a 53 year old female who presented with postmenopausal bleeding.  Patient has multiple medical comorbidities including atrial fibrillation chronic ulcerative colitis essential hypertension morbid obesity.  She underwent biopsy which was positive for endometrial carcinoma grade 1.  CT scan of chest abdomen pelvis showed uterine heterogeneity with a 3.1 sided mass arising from the lower uterine segment compatible with patient's known endometrial adenocarcinoma.  There was no evidence of metastatic disease in the chest abdomen or pelvis.  Patient underwent TLH BSO with sentinel lymph node mapping.  There was a 3.5 cm endometrial carcinoma.  Tumor was FIGO grade 1.  MMR showed intact protein expression depth of myometrial invasion was 15 mm out of 17 mm of myometrial thickness.  This was 88% myometrial invasion.  There was also cervical stromal invasion.  67% of the cervical wall was involved.  There was lymphatic and vascular invasion present greater than equal to 5 foci.  All margins were negative for carcinoma.  There is also focal angiolymphatic invasion involving the right fallopian tube.  Patient has done well postoperatively.  Postoperative examination was unremarkable.  She is now referred to radiation oncology for consideration of treatment.  PLANNED TREATMENT REGIMEN: IMRT radiation therapy to her pelvis plus  vaginal brachytherapy  PAST MEDICAL HISTORY:  has a past medical history of Anemia, Atrial fibrillation (HCC) (02/16/2019), Chronic ulcerative colitis (HCC), Endometrial adenocarcinoma (HCC) (06/2023), Essential hypertension, GERD (gastroesophageal reflux disease), Heart murmur, Morbid obesity with BMI of 50.0-59.9, adult (HCC), Obesity, unspecified, Other abnormal glucose, Paroxysmal atrial fibrillation (HCC), Postmenopausal bleeding, Pre-diabetes, and Right ovarian cyst.    PAST SURGICAL HISTORY:  Past Surgical History:  Procedure Laterality Date   COLONOSCOPY  09/23/2012   CYSTOSCOPY  07/29/2023   Procedure: CYSTOSCOPY;  Surgeon: Hermine Loots, MD;  Location: [facility] ORS;  Service: Gynecology;;   DILATION AND CURETTAGE OF UTERUS     SIGMOIDOSCOPY  09/18/1999   TOTAL LAPAROSCOPIC HYSTERECTOMY WITH BILATERAL SALPINGO OOPHORECTOMY N/A 07/29/2023   Procedure: HYSTERECTOMY, TOTAL, LAPAROSCOPIC, WITH BILATERAL SALPINGO-OOPHORECTOMY; PELVIC WASHINGS;  Surgeon: Hermine Loots, MD;  Location: [facility] ORS;  Service: Gynecology;  Laterality: N/A;   TUBAL LIGATION  1995    FAMILY HISTORY: family history includes Clotting disorder in her maternal uncle and paternal grandfather; Hypertension in her father.  SOCIAL HISTORY:  reports that she has never smoked. She has never used smokeless tobacco. She reports that she does not drink alcohol and does not use drugs.  ALLERGIES: Hydrochlorothiazide  MEDICATIONS:  Current Outpatient Medications  Medication Sig Dispense Refill   chlorthalidone (HYGROTON) 25 MG tablet Take 25 mg by mouth daily.     diltiazem  (CARDIZEM  CD) 240 MG 24 hr capsule Take 240 mg by mouth daily.     diltiazem  (CARDIZEM ) 30 MG tablet Take 1 tablet (30 mg total) by mouth daily as needed (Take as needed for atrial fibrillation and heart rate above 110 that resists your long-acting medicine). 30  tablet 0   escitalopram (LEXAPRO) 10 MG tablet Take 10 mg by mouth daily.     lisinopril  (PRINIVIL,ZESTRIL) 40 MG tablet Take 40 mg by mouth daily.     pantoprazole (PROTONIX) 40 MG tablet Take 40 mg by mouth daily.     celecoxib (CELEBREX) 200 MG capsule Take 200 mg by mouth daily. (Patient not taking: Reported on 09/09/2023)     Cholecalciferol (VITAMIN D3 PO) Take 1,000 mcg by mouth daily. (Patient not taking: Reported on 09/09/2023)     No current facility-administered medications for this encounter.    ECOG PERFORMANCE STATUS:  0 - Asymptomatic  REVIEW OF SYSTEMS: Patient has past medical history of anemia atrial fibrillation chronic ulcerative colitis essential hypertension GERD heart murmur morbid obesity PAF. Patient denies any weight loss, fatigue, weakness, fever, chills or night sweats. Patient denies any loss of vision, blurred vision. Patient denies any ringing  of the ears or hearing loss. No irregular heartbeat. Patient denies heart murmur or history of fainting. Patient denies any chest pain or pain radiating to her upper extremities. Patient denies any shortness of breath, difficulty breathing at night, cough or hemoptysis. Patient denies any swelling in the lower legs. Patient denies any nausea vomiting, vomiting of blood, or coffee ground material in the vomitus. Patient denies any stomach pain. Patient states has had normal bowel movements no significant constipation or diarrhea. Patient denies any dysuria, hematuria or significant nocturia. Patient denies any problems walking, swelling in the joints or loss of balance. Patient denies any skin changes, loss of hair or loss of weight. Patient denies any excessive worrying or anxiety or significant depression. Patient denies any problems with insomnia. Patient denies excessive thirst, polyuria, polydipsia. Patient denies any swollen glands, patient denies easy bruising or easy bleeding. Patient denies any recent infections, allergies or URI. Patient "s visual fields have not changed significantly in recent time.   PHYSICAL  EXAM: BP 125/70   Pulse 84   Temp (!) 97 F (36.1 C) (Tympanic)   Resp 16   Wt (!) 301 lb (136.5 kg)   LMP 05/28/2020 (Approximate) Comment: Rarely has periods  BMI 50.09 kg/m  Patient is obese.  Well-developed well-nourished patient in NAD. HEENT reveals PERLA, EOMI, discs not visualized.  Oral cavity is clear. No oral mucosal lesions are identified. Neck is clear without evidence of cervical or supraclavicular adenopathy. Lungs are clear to A&P. Cardiac examination is essentially unremarkable with regular rate and rhythm without murmur rub or thrill. Abdomen is benign with no organomegaly or masses noted. Motor sensory and DTR levels are equal and symmetric in the upper and lower extremities. Cranial nerves II through XII are grossly intact. Proprioception is intact. No peripheral adenopathy or edema is identified. No motor or sensory levels are noted. Crude visual fields are within normal range.    LABORATORY DATA: Pathology reports reviewed    RADIOLOGY RESULTS: CT scan chest abdomen pelvis reviewed compatible with above-stated findings   IMPRESSION: Stage II based on cervical stromal invasion FIGO grade 1 endometrial adenocarcinoma in a 53 year old female status post TLH BSO and sentinel lymph node mapping  PLAN: At this time I have recommended both IMRT radiation therapy to pelvis plus vaginal brachytherapy.  Would treat her pelvis using IMRT treatment planning and deliver to spare critical structures such as bone marrow rectum small bowel and bladder.  Would also boost using vaginal brachytherapy 18 Gray in 3 fractions using high-dose-rate remote afterloading.  Risks and benefits of treatment including possible  increased lower urinary tract symptoms diarrhea fatigue alteration of blood counts skin reaction all were reviewed in detail with the patient.  I have personally set up and ordered CT simulation for later this week.  Patient comprehends my recommendations well.  I would like to  take this opportunity to thank you for allowing me to participate in the care of your patient.Glenis Langdon, MD

## 2023-09-17 ENCOUNTER — Ambulatory Visit
Admission: RE | Admit: 2023-09-17 | Discharge: 2023-09-17 | Disposition: A | Discharge status: Home / Self Care | Source: Ambulatory Visit | Attending: Radiation Oncology | Admitting: Radiation Oncology

## 2023-09-17 DIAGNOSIS — Z510 Encounter for antineoplastic radiation therapy: Secondary | ICD-10-CM | POA: Insufficient documentation

## 2023-09-17 DIAGNOSIS — C541 Malignant neoplasm of endometrium: Secondary | ICD-10-CM | POA: Insufficient documentation

## 2023-09-23 DIAGNOSIS — C541 Malignant neoplasm of endometrium: Secondary | ICD-10-CM | POA: Diagnosis not present

## 2023-09-28 ENCOUNTER — Ambulatory Visit
Admission: RE | Admit: 2023-09-28 | Discharge: 2023-09-28 | Disposition: A | Discharge status: Home / Self Care | Source: Ambulatory Visit | Attending: Radiation Oncology | Admitting: Radiation Oncology

## 2023-09-29 ENCOUNTER — Ambulatory Visit
Admission: RE | Admit: 2023-09-29 | Discharge: 2023-09-29 | Disposition: A | Discharge status: Home / Self Care | Source: Ambulatory Visit | Attending: Radiation Oncology | Admitting: Radiation Oncology

## 2023-09-29 ENCOUNTER — Other Ambulatory Visit: Payer: Self-pay

## 2023-09-29 DIAGNOSIS — C541 Malignant neoplasm of endometrium: Secondary | ICD-10-CM | POA: Diagnosis not present

## 2023-09-29 LAB — RAD ONC ARIA SESSION SUMMARY
Course Elapsed Days: 0
Plan Fractions Treated to Date: 1
Plan Prescribed Dose Per Fraction: 1.8 Gy
Plan Total Fractions Prescribed: 25
Plan Total Prescribed Dose: 45 Gy
Reference Point Dosage Given to Date: 1.8 Gy
Reference Point Session Dosage Given: 1.8 Gy
Session Number: 1

## 2023-09-30 ENCOUNTER — Ambulatory Visit
Admission: RE | Admit: 2023-09-30 | Discharge: 2023-09-30 | Disposition: A | Discharge status: Home / Self Care | Source: Ambulatory Visit | Attending: Radiation Oncology | Admitting: Radiation Oncology

## 2023-09-30 ENCOUNTER — Other Ambulatory Visit: Payer: Self-pay

## 2023-09-30 DIAGNOSIS — C541 Malignant neoplasm of endometrium: Secondary | ICD-10-CM | POA: Diagnosis not present

## 2023-09-30 LAB — RAD ONC ARIA SESSION SUMMARY
Course Elapsed Days: 1
Plan Fractions Treated to Date: 2
Plan Prescribed Dose Per Fraction: 1.8 Gy
Plan Total Fractions Prescribed: 25
Plan Total Prescribed Dose: 45 Gy
Reference Point Dosage Given to Date: 3.6 Gy
Reference Point Session Dosage Given: 1.8 Gy
Session Number: 2

## 2023-10-01 ENCOUNTER — Ambulatory Visit
Admission: RE | Admit: 2023-10-01 | Discharge: 2023-10-01 | Disposition: A | Discharge status: Home / Self Care | Source: Ambulatory Visit | Attending: Radiation Oncology | Admitting: Radiation Oncology

## 2023-10-01 ENCOUNTER — Other Ambulatory Visit: Payer: Self-pay

## 2023-10-01 DIAGNOSIS — C541 Malignant neoplasm of endometrium: Secondary | ICD-10-CM | POA: Diagnosis not present

## 2023-10-01 LAB — RAD ONC ARIA SESSION SUMMARY
Course Elapsed Days: 2
Plan Fractions Treated to Date: 3
Plan Prescribed Dose Per Fraction: 1.8 Gy
Plan Total Fractions Prescribed: 25
Plan Total Prescribed Dose: 45 Gy
Reference Point Dosage Given to Date: 5.4 Gy
Reference Point Session Dosage Given: 1.8 Gy
Session Number: 3

## 2023-10-02 ENCOUNTER — Ambulatory Visit
Admission: RE | Admit: 2023-10-02 | Discharge: 2023-10-02 | Disposition: A | Discharge status: Home / Self Care | Source: Ambulatory Visit | Attending: Radiation Oncology | Admitting: Radiation Oncology

## 2023-10-02 ENCOUNTER — Other Ambulatory Visit: Payer: Self-pay

## 2023-10-02 DIAGNOSIS — C541 Malignant neoplasm of endometrium: Secondary | ICD-10-CM

## 2023-10-02 LAB — RAD ONC ARIA SESSION SUMMARY
Course Elapsed Days: 3
Plan Fractions Treated to Date: 4
Plan Prescribed Dose Per Fraction: 1.8 Gy
Plan Total Fractions Prescribed: 25
Plan Total Prescribed Dose: 45 Gy
Reference Point Dosage Given to Date: 7.2 Gy
Reference Point Session Dosage Given: 1.8 Gy
Session Number: 4

## 2023-10-05 ENCOUNTER — Ambulatory Visit
Admission: RE | Admit: 2023-10-05 | Discharge: 2023-10-05 | Disposition: A | Discharge status: Home / Self Care | Source: Ambulatory Visit | Attending: Radiation Oncology | Admitting: Radiation Oncology

## 2023-10-05 ENCOUNTER — Other Ambulatory Visit: Payer: Self-pay

## 2023-10-05 ENCOUNTER — Inpatient Hospital Stay: Attending: Obstetrics and Gynecology

## 2023-10-05 DIAGNOSIS — C541 Malignant neoplasm of endometrium: Secondary | ICD-10-CM

## 2023-10-05 LAB — RAD ONC ARIA SESSION SUMMARY
Course Elapsed Days: 6
Plan Fractions Treated to Date: 5
Plan Prescribed Dose Per Fraction: 1.8 Gy
Plan Total Fractions Prescribed: 25
Plan Total Prescribed Dose: 45 Gy
Reference Point Dosage Given to Date: 9 Gy
Reference Point Session Dosage Given: 1.8 Gy
Session Number: 5

## 2023-10-05 LAB — CBC WITH DIFFERENTIAL (CANCER CENTER ONLY)
Abs Immature Granulocytes: 0.04 10*3/uL (ref 0.00–0.07)
Basophils Absolute: 0.1 10*3/uL (ref 0.0–0.1)
Basophils Relative: 1 %
Eosinophils Absolute: 0.1 10*3/uL (ref 0.0–0.5)
Eosinophils Relative: 2 %
HCT: 33.9 % — ABNORMAL LOW (ref 36.0–46.0)
Hemoglobin: 10.5 g/dL — ABNORMAL LOW (ref 12.0–15.0)
Immature Granulocytes: 1 %
Lymphocytes Relative: 23 %
Lymphs Abs: 1.2 10*3/uL (ref 0.7–4.0)
MCH: 24.6 pg — ABNORMAL LOW (ref 26.0–34.0)
MCHC: 31 g/dL (ref 30.0–36.0)
MCV: 79.4 fL — ABNORMAL LOW (ref 80.0–100.0)
Monocytes Absolute: 0.3 10*3/uL (ref 0.1–1.0)
Monocytes Relative: 6 %
Neutro Abs: 3.6 10*3/uL (ref 1.7–7.7)
Neutrophils Relative %: 67 %
Platelet Count: 341 10*3/uL (ref 150–400)
RBC: 4.27 MIL/uL (ref 3.87–5.11)
RDW: 15 % (ref 11.5–15.5)
WBC Count: 5.3 10*3/uL (ref 4.0–10.5)
nRBC: 0 % (ref 0.0–0.2)

## 2023-10-06 ENCOUNTER — Ambulatory Visit
Admission: RE | Admit: 2023-10-06 | Discharge: 2023-10-06 | Disposition: A | Discharge status: Home / Self Care | Source: Ambulatory Visit | Attending: Radiation Oncology | Admitting: Radiation Oncology

## 2023-10-06 ENCOUNTER — Other Ambulatory Visit: Payer: Self-pay

## 2023-10-06 DIAGNOSIS — C541 Malignant neoplasm of endometrium: Secondary | ICD-10-CM | POA: Diagnosis not present

## 2023-10-06 LAB — RAD ONC ARIA SESSION SUMMARY
Course Elapsed Days: 7
Plan Fractions Treated to Date: 6
Plan Prescribed Dose Per Fraction: 1.8 Gy
Plan Total Fractions Prescribed: 25
Plan Total Prescribed Dose: 45 Gy
Reference Point Dosage Given to Date: 10.8 Gy
Reference Point Session Dosage Given: 1.8 Gy
Session Number: 6

## 2023-10-07 ENCOUNTER — Ambulatory Visit
Admission: RE | Admit: 2023-10-07 | Discharge: 2023-10-07 | Disposition: A | Discharge status: Home / Self Care | Source: Ambulatory Visit | Attending: Radiation Oncology | Admitting: Radiation Oncology

## 2023-10-07 ENCOUNTER — Other Ambulatory Visit: Payer: Self-pay

## 2023-10-07 DIAGNOSIS — C541 Malignant neoplasm of endometrium: Secondary | ICD-10-CM | POA: Diagnosis not present

## 2023-10-07 LAB — RAD ONC ARIA SESSION SUMMARY
Course Elapsed Days: 8
Plan Fractions Treated to Date: 7
Plan Prescribed Dose Per Fraction: 1.8 Gy
Plan Total Fractions Prescribed: 25
Plan Total Prescribed Dose: 45 Gy
Reference Point Dosage Given to Date: 12.6 Gy
Reference Point Session Dosage Given: 1.8 Gy
Session Number: 7

## 2023-10-08 ENCOUNTER — Other Ambulatory Visit: Payer: Self-pay

## 2023-10-08 ENCOUNTER — Ambulatory Visit
Admission: RE | Admit: 2023-10-08 | Discharge: 2023-10-08 | Disposition: A | Discharge status: Home / Self Care | Source: Ambulatory Visit | Attending: Radiation Oncology | Admitting: Radiation Oncology

## 2023-10-08 DIAGNOSIS — C541 Malignant neoplasm of endometrium: Secondary | ICD-10-CM | POA: Diagnosis not present

## 2023-10-08 LAB — RAD ONC ARIA SESSION SUMMARY
Course Elapsed Days: 9
Plan Fractions Treated to Date: 8
Plan Prescribed Dose Per Fraction: 1.8 Gy
Plan Total Fractions Prescribed: 25
Plan Total Prescribed Dose: 45 Gy
Reference Point Dosage Given to Date: 14.4 Gy
Reference Point Session Dosage Given: 1.8 Gy
Session Number: 8

## 2023-10-09 ENCOUNTER — Other Ambulatory Visit: Payer: Self-pay

## 2023-10-09 ENCOUNTER — Ambulatory Visit
Admission: RE | Admit: 2023-10-09 | Discharge: 2023-10-09 | Disposition: A | Discharge status: Home / Self Care | Source: Ambulatory Visit | Attending: Radiation Oncology | Admitting: Radiation Oncology

## 2023-10-09 DIAGNOSIS — C541 Malignant neoplasm of endometrium: Secondary | ICD-10-CM | POA: Diagnosis not present

## 2023-10-09 LAB — RAD ONC ARIA SESSION SUMMARY
Course Elapsed Days: 10
Plan Fractions Treated to Date: 9
Plan Prescribed Dose Per Fraction: 1.8 Gy
Plan Total Fractions Prescribed: 25
Plan Total Prescribed Dose: 45 Gy
Reference Point Dosage Given to Date: 16.2 Gy
Reference Point Session Dosage Given: 1.8 Gy
Session Number: 9

## 2023-10-13 ENCOUNTER — Ambulatory Visit
Admission: RE | Admit: 2023-10-13 | Discharge: 2023-10-13 | Disposition: A | Discharge status: Home / Self Care | Source: Ambulatory Visit | Attending: Radiation Oncology | Admitting: Radiation Oncology

## 2023-10-13 ENCOUNTER — Other Ambulatory Visit: Payer: Self-pay

## 2023-10-13 DIAGNOSIS — C541 Malignant neoplasm of endometrium: Secondary | ICD-10-CM | POA: Diagnosis not present

## 2023-10-13 LAB — RAD ONC ARIA SESSION SUMMARY
Course Elapsed Days: 14
Plan Fractions Treated to Date: 10
Plan Prescribed Dose Per Fraction: 1.8 Gy
Plan Total Fractions Prescribed: 25
Plan Total Prescribed Dose: 45 Gy
Reference Point Dosage Given to Date: 18 Gy
Reference Point Session Dosage Given: 1.8 Gy
Session Number: 10

## 2023-10-14 ENCOUNTER — Other Ambulatory Visit: Payer: Self-pay

## 2023-10-14 ENCOUNTER — Other Ambulatory Visit: Payer: Self-pay | Admitting: *Deleted

## 2023-10-14 ENCOUNTER — Ambulatory Visit
Admission: RE | Admit: 2023-10-14 | Discharge: 2023-10-14 | Disposition: A | Discharge status: Home / Self Care | Source: Ambulatory Visit | Attending: Radiation Oncology | Admitting: Radiation Oncology

## 2023-10-14 DIAGNOSIS — C541 Malignant neoplasm of endometrium: Secondary | ICD-10-CM | POA: Diagnosis not present

## 2023-10-14 LAB — RAD ONC ARIA SESSION SUMMARY
Course Elapsed Days: 15
Plan Fractions Treated to Date: 11
Plan Prescribed Dose Per Fraction: 1.8 Gy
Plan Total Fractions Prescribed: 25
Plan Total Prescribed Dose: 45 Gy
Reference Point Dosage Given to Date: 19.8 Gy
Reference Point Session Dosage Given: 1.8 Gy
Session Number: 11

## 2023-10-15 ENCOUNTER — Other Ambulatory Visit: Payer: Self-pay

## 2023-10-15 ENCOUNTER — Ambulatory Visit
Admission: RE | Admit: 2023-10-15 | Discharge: 2023-10-15 | Disposition: A | Discharge status: Home / Self Care | Source: Ambulatory Visit | Attending: Radiation Oncology | Admitting: Radiation Oncology

## 2023-10-15 DIAGNOSIS — C541 Malignant neoplasm of endometrium: Secondary | ICD-10-CM | POA: Diagnosis not present

## 2023-10-15 LAB — RAD ONC ARIA SESSION SUMMARY
Course Elapsed Days: 16
Plan Fractions Treated to Date: 12
Plan Prescribed Dose Per Fraction: 1.8 Gy
Plan Total Fractions Prescribed: 25
Plan Total Prescribed Dose: 45 Gy
Reference Point Dosage Given to Date: 21.6 Gy
Reference Point Session Dosage Given: 1.8 Gy
Session Number: 12

## 2023-10-16 ENCOUNTER — Ambulatory Visit
Admission: RE | Admit: 2023-10-16 | Discharge: 2023-10-16 | Disposition: A | Discharge status: Home / Self Care | Source: Ambulatory Visit | Attending: Radiation Oncology | Admitting: Radiation Oncology

## 2023-10-16 ENCOUNTER — Other Ambulatory Visit: Payer: Self-pay

## 2023-10-16 DIAGNOSIS — C541 Malignant neoplasm of endometrium: Secondary | ICD-10-CM | POA: Diagnosis not present

## 2023-10-16 LAB — RAD ONC ARIA SESSION SUMMARY
Course Elapsed Days: 17
Plan Fractions Treated to Date: 13
Plan Prescribed Dose Per Fraction: 1.8 Gy
Plan Total Fractions Prescribed: 25
Plan Total Prescribed Dose: 45 Gy
Reference Point Dosage Given to Date: 23.4 Gy
Reference Point Session Dosage Given: 1.8 Gy
Session Number: 13

## 2023-10-19 ENCOUNTER — Ambulatory Visit
Admission: RE | Admit: 2023-10-19 | Discharge: 2023-10-19 | Disposition: A | Discharge status: Home / Self Care | Source: Ambulatory Visit | Attending: Radiation Oncology | Admitting: Radiation Oncology

## 2023-10-19 ENCOUNTER — Inpatient Hospital Stay

## 2023-10-19 ENCOUNTER — Other Ambulatory Visit: Payer: Self-pay

## 2023-10-19 DIAGNOSIS — C541 Malignant neoplasm of endometrium: Secondary | ICD-10-CM | POA: Insufficient documentation

## 2023-10-19 DIAGNOSIS — Z510 Encounter for antineoplastic radiation therapy: Secondary | ICD-10-CM | POA: Diagnosis present

## 2023-10-19 LAB — CBC (CANCER CENTER ONLY)
HCT: 32.7 % — ABNORMAL LOW (ref 36.0–46.0)
Hemoglobin: 10.4 g/dL — ABNORMAL LOW (ref 12.0–15.0)
MCH: 25.4 pg — ABNORMAL LOW (ref 26.0–34.0)
MCHC: 31.8 g/dL (ref 30.0–36.0)
MCV: 79.8 fL — ABNORMAL LOW (ref 80.0–100.0)
Platelet Count: 245 10*3/uL (ref 150–400)
RBC: 4.1 MIL/uL (ref 3.87–5.11)
RDW: 15.9 % — ABNORMAL HIGH (ref 11.5–15.5)
WBC Count: 4.8 10*3/uL (ref 4.0–10.5)
nRBC: 0 % (ref 0.0–0.2)

## 2023-10-19 LAB — RAD ONC ARIA SESSION SUMMARY
Course Elapsed Days: 20
Plan Fractions Treated to Date: 14
Plan Prescribed Dose Per Fraction: 1.8 Gy
Plan Total Fractions Prescribed: 25
Plan Total Prescribed Dose: 45 Gy
Reference Point Dosage Given to Date: 25.2 Gy
Reference Point Session Dosage Given: 1.8 Gy
Session Number: 14

## 2023-10-20 ENCOUNTER — Ambulatory Visit
Admission: RE | Admit: 2023-10-20 | Discharge: 2023-10-20 | Disposition: A | Discharge status: Home / Self Care | Source: Ambulatory Visit | Attending: Radiation Oncology | Admitting: Radiation Oncology

## 2023-10-20 ENCOUNTER — Other Ambulatory Visit: Payer: Self-pay

## 2023-10-20 DIAGNOSIS — Z510 Encounter for antineoplastic radiation therapy: Secondary | ICD-10-CM | POA: Diagnosis not present

## 2023-10-20 LAB — RAD ONC ARIA SESSION SUMMARY
Course Elapsed Days: 21
Plan Fractions Treated to Date: 15
Plan Prescribed Dose Per Fraction: 1.8 Gy
Plan Total Fractions Prescribed: 25
Plan Total Prescribed Dose: 45 Gy
Reference Point Dosage Given to Date: 27 Gy
Reference Point Session Dosage Given: 1.8 Gy
Session Number: 15

## 2023-10-21 ENCOUNTER — Ambulatory Visit
Admission: RE | Admit: 2023-10-21 | Discharge: 2023-10-21 | Disposition: A | Discharge status: Home / Self Care | Source: Ambulatory Visit | Attending: Radiation Oncology | Admitting: Radiation Oncology

## 2023-10-21 ENCOUNTER — Other Ambulatory Visit: Payer: Self-pay

## 2023-10-21 DIAGNOSIS — Z510 Encounter for antineoplastic radiation therapy: Secondary | ICD-10-CM | POA: Diagnosis not present

## 2023-10-21 LAB — RAD ONC ARIA SESSION SUMMARY
Course Elapsed Days: 22
Plan Fractions Treated to Date: 16
Plan Prescribed Dose Per Fraction: 1.8 Gy
Plan Total Fractions Prescribed: 25
Plan Total Prescribed Dose: 45 Gy
Reference Point Dosage Given to Date: 28.8 Gy
Reference Point Session Dosage Given: 1.8 Gy
Session Number: 16

## 2023-10-22 ENCOUNTER — Other Ambulatory Visit: Payer: Self-pay

## 2023-10-22 ENCOUNTER — Ambulatory Visit
Admission: RE | Admit: 2023-10-22 | Discharge: 2023-10-22 | Disposition: A | Discharge status: Home / Self Care | Source: Ambulatory Visit | Attending: Radiation Oncology | Admitting: Radiation Oncology

## 2023-10-22 DIAGNOSIS — Z510 Encounter for antineoplastic radiation therapy: Secondary | ICD-10-CM | POA: Diagnosis not present

## 2023-10-22 LAB — RAD ONC ARIA SESSION SUMMARY
Course Elapsed Days: 23
Plan Fractions Treated to Date: 17
Plan Prescribed Dose Per Fraction: 1.8 Gy
Plan Total Fractions Prescribed: 25
Plan Total Prescribed Dose: 45 Gy
Reference Point Dosage Given to Date: 30.6 Gy
Reference Point Session Dosage Given: 1.8 Gy
Session Number: 17

## 2023-10-23 ENCOUNTER — Other Ambulatory Visit: Payer: Self-pay

## 2023-10-23 ENCOUNTER — Ambulatory Visit
Admission: RE | Admit: 2023-10-23 | Discharge: 2023-10-23 | Disposition: A | Discharge status: Home / Self Care | Source: Ambulatory Visit | Attending: Radiation Oncology | Admitting: Radiation Oncology

## 2023-10-23 DIAGNOSIS — Z510 Encounter for antineoplastic radiation therapy: Secondary | ICD-10-CM | POA: Diagnosis not present

## 2023-10-23 LAB — RAD ONC ARIA SESSION SUMMARY
Course Elapsed Days: 24
Plan Fractions Treated to Date: 18
Plan Prescribed Dose Per Fraction: 1.8 Gy
Plan Total Fractions Prescribed: 25
Plan Total Prescribed Dose: 45 Gy
Reference Point Dosage Given to Date: 32.4 Gy
Reference Point Session Dosage Given: 1.8 Gy
Session Number: 18

## 2023-10-26 ENCOUNTER — Ambulatory Visit
Admission: RE | Admit: 2023-10-26 | Discharge: 2023-10-26 | Disposition: A | Discharge status: Home / Self Care | Source: Ambulatory Visit | Attending: Radiation Oncology | Admitting: Radiation Oncology

## 2023-10-26 ENCOUNTER — Other Ambulatory Visit: Payer: Self-pay

## 2023-10-26 DIAGNOSIS — Z510 Encounter for antineoplastic radiation therapy: Secondary | ICD-10-CM | POA: Diagnosis not present

## 2023-10-26 LAB — RAD ONC ARIA SESSION SUMMARY
Course Elapsed Days: 27
Plan Fractions Treated to Date: 19
Plan Prescribed Dose Per Fraction: 1.8 Gy
Plan Total Fractions Prescribed: 25
Plan Total Prescribed Dose: 45 Gy
Reference Point Dosage Given to Date: 34.2 Gy
Reference Point Session Dosage Given: 1.8 Gy
Session Number: 19

## 2023-10-27 ENCOUNTER — Other Ambulatory Visit: Payer: Self-pay

## 2023-10-27 ENCOUNTER — Ambulatory Visit
Admission: RE | Admit: 2023-10-27 | Discharge: 2023-10-27 | Disposition: A | Discharge status: Home / Self Care | Source: Ambulatory Visit | Attending: Radiation Oncology | Admitting: Radiation Oncology

## 2023-10-27 DIAGNOSIS — Z510 Encounter for antineoplastic radiation therapy: Secondary | ICD-10-CM | POA: Diagnosis not present

## 2023-10-27 LAB — RAD ONC ARIA SESSION SUMMARY
Course Elapsed Days: 28
Plan Fractions Treated to Date: 20
Plan Prescribed Dose Per Fraction: 1.8 Gy
Plan Total Fractions Prescribed: 25
Plan Total Prescribed Dose: 45 Gy
Reference Point Dosage Given to Date: 36 Gy
Reference Point Session Dosage Given: 1.8 Gy
Session Number: 20

## 2023-10-28 ENCOUNTER — Other Ambulatory Visit: Payer: Self-pay

## 2023-10-28 ENCOUNTER — Ambulatory Visit
Admission: RE | Admit: 2023-10-28 | Discharge: 2023-10-28 | Disposition: A | Discharge status: Home / Self Care | Source: Ambulatory Visit | Attending: Radiation Oncology | Admitting: Radiation Oncology

## 2023-10-28 DIAGNOSIS — Z510 Encounter for antineoplastic radiation therapy: Secondary | ICD-10-CM | POA: Diagnosis not present

## 2023-10-28 LAB — RAD ONC ARIA SESSION SUMMARY
Course Elapsed Days: 29
Plan Fractions Treated to Date: 21
Plan Prescribed Dose Per Fraction: 1.8 Gy
Plan Total Fractions Prescribed: 25
Plan Total Prescribed Dose: 45 Gy
Reference Point Dosage Given to Date: 37.8 Gy
Reference Point Session Dosage Given: 1.8 Gy
Session Number: 21

## 2023-10-29 ENCOUNTER — Ambulatory Visit
Admission: RE | Admit: 2023-10-29 | Discharge: 2023-10-29 | Disposition: A | Discharge status: Home / Self Care | Source: Ambulatory Visit | Attending: Radiation Oncology | Admitting: Radiation Oncology

## 2023-10-29 ENCOUNTER — Other Ambulatory Visit: Payer: Self-pay

## 2023-10-29 DIAGNOSIS — Z510 Encounter for antineoplastic radiation therapy: Secondary | ICD-10-CM | POA: Diagnosis not present

## 2023-10-29 LAB — RAD ONC ARIA SESSION SUMMARY
Course Elapsed Days: 30
Plan Fractions Treated to Date: 22
Plan Prescribed Dose Per Fraction: 1.8 Gy
Plan Total Fractions Prescribed: 25
Plan Total Prescribed Dose: 45 Gy
Reference Point Dosage Given to Date: 39.6 Gy
Reference Point Session Dosage Given: 1.8 Gy
Session Number: 22

## 2023-10-30 ENCOUNTER — Other Ambulatory Visit: Payer: Self-pay

## 2023-10-30 ENCOUNTER — Ambulatory Visit
Admission: RE | Admit: 2023-10-30 | Discharge: 2023-10-30 | Disposition: A | Discharge status: Home / Self Care | Source: Ambulatory Visit | Attending: Radiation Oncology | Admitting: Radiation Oncology

## 2023-10-30 DIAGNOSIS — Z510 Encounter for antineoplastic radiation therapy: Secondary | ICD-10-CM | POA: Diagnosis not present

## 2023-10-30 LAB — RAD ONC ARIA SESSION SUMMARY
Course Elapsed Days: 31
Plan Fractions Treated to Date: 23
Plan Prescribed Dose Per Fraction: 1.8 Gy
Plan Total Fractions Prescribed: 25
Plan Total Prescribed Dose: 45 Gy
Reference Point Dosage Given to Date: 41.4 Gy
Reference Point Session Dosage Given: 1.8 Gy
Session Number: 23

## 2023-11-02 ENCOUNTER — Ambulatory Visit
Admission: RE | Admit: 2023-11-02 | Discharge: 2023-11-02 | Disposition: A | Discharge status: Home / Self Care | Source: Ambulatory Visit | Attending: Radiation Oncology | Admitting: Radiation Oncology

## 2023-11-02 ENCOUNTER — Inpatient Hospital Stay

## 2023-11-02 ENCOUNTER — Other Ambulatory Visit: Payer: Self-pay

## 2023-11-02 DIAGNOSIS — Z510 Encounter for antineoplastic radiation therapy: Secondary | ICD-10-CM | POA: Diagnosis not present

## 2023-11-02 DIAGNOSIS — C541 Malignant neoplasm of endometrium: Secondary | ICD-10-CM

## 2023-11-02 LAB — RAD ONC ARIA SESSION SUMMARY
Course Elapsed Days: 34
Plan Fractions Treated to Date: 24
Plan Prescribed Dose Per Fraction: 1.8 Gy
Plan Total Fractions Prescribed: 25
Plan Total Prescribed Dose: 45 Gy
Reference Point Dosage Given to Date: 43.2 Gy
Reference Point Session Dosage Given: 1.8 Gy
Session Number: 24

## 2023-11-02 LAB — CBC (CANCER CENTER ONLY)
HCT: 34.2 % — ABNORMAL LOW (ref 36.0–46.0)
Hemoglobin: 10.8 g/dL — ABNORMAL LOW (ref 12.0–15.0)
MCH: 25.1 pg — ABNORMAL LOW (ref 26.0–34.0)
MCHC: 31.6 g/dL (ref 30.0–36.0)
MCV: 79.4 fL — ABNORMAL LOW (ref 80.0–100.0)
Platelet Count: 357 10*3/uL (ref 150–400)
RBC: 4.31 MIL/uL (ref 3.87–5.11)
RDW: 16.3 % — ABNORMAL HIGH (ref 11.5–15.5)
WBC Count: 7 10*3/uL (ref 4.0–10.5)
nRBC: 0 % (ref 0.0–0.2)

## 2023-11-03 ENCOUNTER — Other Ambulatory Visit: Payer: Self-pay

## 2023-11-03 ENCOUNTER — Ambulatory Visit
Admission: RE | Admit: 2023-11-03 | Discharge: 2023-11-03 | Disposition: A | Discharge status: Home / Self Care | Source: Ambulatory Visit | Attending: Radiation Oncology | Admitting: Radiation Oncology

## 2023-11-03 DIAGNOSIS — Z510 Encounter for antineoplastic radiation therapy: Secondary | ICD-10-CM | POA: Diagnosis not present

## 2023-11-03 LAB — RAD ONC ARIA SESSION SUMMARY
Course Elapsed Days: 35
Plan Fractions Treated to Date: 25
Plan Prescribed Dose Per Fraction: 1.8 Gy
Plan Total Fractions Prescribed: 25
Plan Total Prescribed Dose: 45 Gy
Reference Point Dosage Given to Date: 45 Gy
Reference Point Session Dosage Given: 1.8 Gy
Session Number: 25

## 2023-11-12 ENCOUNTER — Other Ambulatory Visit: Payer: Self-pay

## 2023-11-12 ENCOUNTER — Ambulatory Visit
Admission: RE | Admit: 2023-11-12 | Discharge: 2023-11-12 | Disposition: A | Discharge status: Home / Self Care | Source: Ambulatory Visit | Attending: Radiation Oncology | Admitting: Radiation Oncology

## 2023-11-12 DIAGNOSIS — Z510 Encounter for antineoplastic radiation therapy: Secondary | ICD-10-CM | POA: Diagnosis not present

## 2023-11-12 LAB — RAD ONC ARIA SESSION SUMMARY
Course Elapsed Days: 44
Plan Fractions Treated to Date: 1
Plan Prescribed Dose Per Fraction: 6 Gy
Plan Total Fractions Prescribed: 3
Plan Total Prescribed Dose: 18 Gy
Reference Point Dosage Given to Date: 1.6464 Gy
Reference Point Dosage Given to Date: 4.0947 Gy
Reference Point Dosage Given to Date: 4.532 Gy
Reference Point Dosage Given to Date: 6 Gy
Reference Point Session Dosage Given: 1.6464 Gy
Reference Point Session Dosage Given: 4.0947 Gy
Reference Point Session Dosage Given: 4.532 Gy
Reference Point Session Dosage Given: 6 Gy
Session Number: 26

## 2023-11-16 ENCOUNTER — Ambulatory Visit
Admission: RE | Admit: 2023-11-16 | Discharge: 2023-11-16 | Disposition: A | Discharge status: Home / Self Care | Source: Ambulatory Visit | Attending: Radiation Oncology | Admitting: Radiation Oncology

## 2023-11-16 ENCOUNTER — Other Ambulatory Visit: Payer: Self-pay

## 2023-11-16 DIAGNOSIS — Z510 Encounter for antineoplastic radiation therapy: Secondary | ICD-10-CM | POA: Diagnosis not present

## 2023-11-16 LAB — RAD ONC ARIA SESSION SUMMARY
Course Elapsed Days: 48
Plan Fractions Treated to Date: 2
Plan Prescribed Dose Per Fraction: 6 Gy
Plan Total Fractions Prescribed: 3
Plan Total Prescribed Dose: 18 Gy
Reference Point Dosage Given to Date: 12 Gy
Reference Point Dosage Given to Date: 3.2928 Gy
Reference Point Dosage Given to Date: 8.1894 Gy
Reference Point Dosage Given to Date: 9.064 Gy
Reference Point Session Dosage Given: 1.6464 Gy
Reference Point Session Dosage Given: 4.0947 Gy
Reference Point Session Dosage Given: 4.532 Gy
Reference Point Session Dosage Given: 6 Gy
Session Number: 27

## 2023-11-18 ENCOUNTER — Ambulatory Visit
Admission: RE | Admit: 2023-11-18 | Discharge: 2023-11-18 | Disposition: A | Discharge status: Home / Self Care | Source: Ambulatory Visit | Attending: Radiation Oncology | Admitting: Radiation Oncology

## 2023-11-18 ENCOUNTER — Other Ambulatory Visit: Payer: Self-pay

## 2023-11-18 DIAGNOSIS — Z510 Encounter for antineoplastic radiation therapy: Secondary | ICD-10-CM | POA: Diagnosis present

## 2023-11-18 DIAGNOSIS — C541 Malignant neoplasm of endometrium: Secondary | ICD-10-CM | POA: Insufficient documentation

## 2023-11-18 LAB — RAD ONC ARIA SESSION SUMMARY
Course Elapsed Days: 50
Plan Fractions Treated to Date: 3
Plan Prescribed Dose Per Fraction: 6 Gy
Plan Total Fractions Prescribed: 3
Plan Total Prescribed Dose: 18 Gy
Reference Point Dosage Given to Date: 12.2841 Gy
Reference Point Dosage Given to Date: 13.5961 Gy
Reference Point Dosage Given to Date: 18 Gy
Reference Point Dosage Given to Date: 4.9392 Gy
Reference Point Session Dosage Given: 1.6464 Gy
Reference Point Session Dosage Given: 4.0947 Gy
Reference Point Session Dosage Given: 4.532 Gy
Reference Point Session Dosage Given: 6 Gy
Session Number: 28

## 2023-11-19 ENCOUNTER — Ambulatory Visit

## 2023-11-19 NOTE — Radiation Completion Notes (Signed)
 Patient Name: Rebecca Compton, Rebecca Compton MRN: [id number] Date of Birth: [birth year] Referring Physician: D[facility], M.D. Date of Service: 2023-11-19 Radiation Oncologist: Marcey Penton, M.D. 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  Patient has multiple medical comorbidities including atrial fibrillation chronic ulcerative colitis essential hypertension morbid obesity.  She underwent biopsy which was positive for endometrial carcinoma grade 1.  CT scan of chest abdomen pelvis showed uterine heterogeneity with a 3.1 sided mass arising from the lower uterine segment compatible with patient's known endometrial adenocarcinoma.  There was no evidence of metastatic disease in the chest abdomen or pelvis.  Patient underwent TLH BSO with sentinel lymph node mapping.  There was a 3.5 cm endometrial carcinoma.  Tumor was FIGO grade 1.  MMR showed intact protein expression depth of myometrial invasion was 15 mm out of 17 mm of myometrial thickness.  This was 88% myometrial invasion.  There was also cervical stromal invasion.  67% of the cervical wall was involved.  There was lymphatic and vascular invasion present greater than equal to 5 foci.  All margins were negative for carcinoma.  There is also focal angiolymphatic invasion involving the right fallopian tube.  Patient has done well postoperatively.  Postoperative examination was unremarkable.  She is now referred to radiation oncology for consideration of treatment.      ==========DELIVERED PLANS==========  First Treatment Date: 2023-09-29 Last Treatment Date: 2023-11-18   Plan Name: Pelvis Site: Uterus Technique: IMRT Mode: Photon Dose Per Fraction: 1.8 Gy Prescribed Dose (Delivered / Prescribed): 45 Gy / 45 Gy Prescribed Fxs  (Delivered / Prescribed): 25 / 25   Plan Name: Uterus Bst Site: Uterus Technique: HDR Ir-192 Mode: Brachytherapy Dose Per Fraction: 6 Gy Prescribed Dose (Delivered / Prescribed): 18 Gy / 18 Gy Prescribed Fxs (Delivered / Prescribed): 3 / 3     ==========ON TREATMENT VISIT DATES========== 2023-09-29, 2023-10-06, 2023-10-13, 2023-10-20, 2023-10-20, 2023-10-27, 2023-11-03, 2023-11-12, 2023-11-16, 2023-11-16, 2023-11-18     ==========UPCOMING VISITS========== 12/28/2023 [facility] RAD ONCOLOGY FOLLOW UP 30 Penton Marcey, MD  12/09/2023 [facility] MED ONC EST PT [facility]        ==========APPENDIX - ON TREATMENT VISIT NOTES==========   See weekly On Treatment Notes in Epic for details in the Media tab (listed as Progress notes on the On Treatment Visit Dates listed above).

## 2023-12-09 ENCOUNTER — Inpatient Hospital Stay: Attending: Obstetrics and Gynecology | Admitting: Obstetrics and Gynecology

## 2023-12-09 VITALS — BP 136/69 | HR 85 | Temp 98.6°F | Resp 20 | Wt 297.1 lb

## 2023-12-09 DIAGNOSIS — Z923 Personal history of irradiation: Secondary | ICD-10-CM | POA: Diagnosis not present

## 2023-12-09 DIAGNOSIS — C541 Malignant neoplasm of endometrium: Secondary | ICD-10-CM | POA: Diagnosis present

## 2023-12-09 DIAGNOSIS — Z90722 Acquired absence of ovaries, bilateral: Secondary | ICD-10-CM | POA: Insufficient documentation

## 2023-12-09 DIAGNOSIS — Z9079 Acquired absence of other genital organ(s): Secondary | ICD-10-CM | POA: Diagnosis not present

## 2023-12-09 DIAGNOSIS — Z90710 Acquired absence of both cervix and uterus: Secondary | ICD-10-CM | POA: Diagnosis not present

## 2023-12-09 NOTE — Progress Notes (Signed)
 Gynecologic Oncology Consult Visit   Referring Provider: Dr Beverli Dinsmore  Chief Complaint: Grade 1 endometrial adenocarcinoma, surveillance  Subjective:  Rebecca Compton is a 53 y.o. P2 female who is seen in consultation from Dr. Maryl [facility] for endometrial adenocarcinoma.   Recently finished radiation.  Had some diarrhea, but resolved now.  Radiation First Treatment Date: 2023-09-29 Last Treatment Date: 2023-11-18   Plan Name: Pelvis Site: Uterus Technique: IMRT Mode: Photon Dose Per Fraction: 1.8 Gy Prescribed Dose (Delivered / Prescribed): 45 Gy / 45 Gy Prescribed Fxs (Delivered / Prescribed): 25 / 25   Plan Name: Uterus Bst Site: Uterus Technique: HDR Ir-192 Mode: Brachytherapy Dose Per Fraction: 6 Gy Prescribed Dose (Delivered / Prescribed): 18 Gy / 18 Gy Prescribed Fxs (Delivered / Prescribed): 3 / 3  Oncology history Patient has history of obseity, htn, hld, uc, prediabetic, intermittent pAfib on diltiazem , who presented to gynecology for PMB. No history of HRT since menopause age 65.  NSVD x 2.  Pelvic ultrasound 07/07/23 11x7x5cm uterus, fluid filled endometrium measuring 6.15mm vs 10.59mm (including fluid), 2.5cm complex R ovarian cyst   Pap History: 07/19/15 NILM with neg HRHPV 04/24/21 NILM  06/24/23 AGC with neg HRHPV   EMB, colpo with biopsies and ECC on 07/07/23 Cervix looked normal. Part A-Cervical Biopsy,3:00- FRAGMENTS OF ADENOCARCINOMA, CONSISTENT WITH ENDOMETRIOID ADENOCARCINOMA (FIGO I) WITH SQUAMOUS METAPLASIA.   Part B-Cervical Biopsy,6:00- BENIGN ECTOCERVICAL TISSUE. NEGATIVE FOR DYSPLASIA AND MALIGNANCY. TRANSFORMATION ZONE NOT REPRESENTED.   Part C-Cervical Biopsy,9:00- MINUTE FRAGMENTS OF BENIGN ENDOCERVICAL GLANDS, SQUAMOUS EPITHELIUM, BLOOD, AND MUCUS. EXTREMELY SCANTY. NO DYSPLASIA.   Part D-Cervical Biopsy,10:00- BENIGN ECTOCERVICAL TISSUE, MIXED WITH FRAGMENTS OF ADENOCARCINOMA, CONSISTENT WITH ENDOMETRIOID ADENOCARCINOMA (FIGO I).    Part E-Cervical Biopsy,12:00- MILD CHRONIC CERVICITIS AND FRAGMENTS OF  ADENOCARCINOMA WITH SQUAMOUS METAPLASIA, CONSISTENT WITH  ENDOMETRIOID ADENOCARCINOMA (FIGO I).  Part F-cervical tissue/clot ,Cervical Biopsy- FRAGMENTS OF ADENOCARCINOMA, WITH SQUAMOUS METAPLASIA, CONSISTENT WITH ENDOMETRIOID ADENOCARCINOMA (FIGO I), MIXED WITH MINUTE FRAGMENTS OF BENIGN  ENDOCERVICAL GLANDS, SQUAMOUS EPITHELIUM, BLOOD, AND MUCUS.  Part G-Endometrial Biopsy - ENDOMETRIOID ADENOCARCINOMA (FIGO I) WITH SQUAMOUS METAPLASIA.   Part H-Endocervical Curettings- FRAGMENTS OF ADENOCARCINOMA, WITH SQUAMOUS METAPLASIA, CONSISTENT WITH ENDOMETRIOID ADENOCARCINOMA (FIGO I).   CT scan 07/17/23  IMPRESSION: 1. Uterine heterogeneity with a 3.1 cm mass arising from the lower uterine segment, compatible with patient's known endometrial adenocarcinoma. 2. Fullness of the left-greater-than-right ovaries, nonspecific consider further evaluation with pelvic ultrasound. 3. No evidence of metastatic disease within the chest, abdomen or pelvis. 4. Patchy ground-glass opacity in the paramedian right lower lobe, likely infectious or inflammatory. 5. Esophageal wall thickening slightly anteriorly asymmetric in the distal esophagus, nonspecific but possibly reflecting esophagitis. Consider further evaluation with endoscopy. 6. Diffuse hepatic steatosis.   TLH/BSO and SLN mapping and biopsies at [facility] on 07/29/23.    FINAL DIAGNOSIS       1. Lymph node, sentinel, biopsy, left external iliac :      - THREE LYMPH NODE NEGATIVE FOR MALIGNANCY (0/3).       2. Lymph node, sentinel, biopsy, right external iliac :      - THREE LYMPH NODE NEGATIVE FOR MALIGNANCY (0/3).       3. Uterus +/- tubes/ovaries, neoplastic, and ovaries :      - ENDOMETRIOID CARCINOMA.      - SEE CANCER SUMMARY BELOW.      - ENDOCERVIX WITH NABOTHIAN CYSTS.      - MYOMETRIUM WITH LEIOMYOMATA, LARGEST MEASURING 3.5 CM.      -  BILATERAL FALLOPIAN  TUBES WITH FIMBRIATED END; STATUS POST TUBAL LIGATION.      - BILATERAL OVARIES WITH SIMPLE CYSTS.      - PARATUBAL CYSTS.       Diagnosis Note : Since the carcinoma extensively involves the lower uterine      segment a limited panel of stains was performed.The carcinoma is positive for ER      and PR and demonstrates patchy p16 staining.This pattern of immunoreactivity      supports classification as endometrioid carcinoma.Pancytokeratin stains were      performed on two sentinel lymph nodes to evaluate two foci of cauterized      tissue.Stain controls worked appropriately.    TUMOR Histologic Type: Endometrioid carcinoma Histologic Grade: FIGO grade 1 Molecular Type: MMR Immunohistochemistry: Intact MMR protein expression p53 Immunohistochemistry: Normal (wild-type) expression     Myometrial Invasion: Present Depth of invasion (millimeters): 15 mm Myometrial thickness (millimeters): 17 mm Percentage of myometrial invasion: 88% Uterine Serosa Involvement: Not identified Cervical Involvement: Cervical stromal invasion Percentage of Cervical Wall Involved: 67% Other Tissue/Organ Involvement: Not identified (other tissue/organ submitted in not involved) Pelvic washings: Malignant cells present Lymphatic and/or Vascular Invasion: Present Greater than or equal to 5 foci  MARGINS Margin Status: All margins negative for carcinoma ADDITIONAL FINDINGS: focal angiolymphatic invasion involving the right fallopian tube   REGIONAL LYMPH NODES Regional Lymph Node Status: All regional lymph nodes negative for tumor cells Lymph Nodes Examined:    Problem List: Patient Active Problem List   Diagnosis Date Noted   Counseling and coordination of care 09/09/2023   Atrial fibrillation (HCC) 07/15/2023   Endometrial cancer (HCC) 07/15/2023   Heart murmur 02/03/2013   Essential hypertension     Past Medical History: Past Medical History:  Diagnosis Date   Anemia    Atrial fibrillation  (HCC) 02/16/2019   new onset   Chronic ulcerative colitis (HCC)    Endometrial adenocarcinoma (HCC) 06/2023   Essential hypertension    GERD (gastroesophageal reflux disease)    Heart murmur    Morbid obesity with BMI of 50.0-59.9, adult (HCC)    Obesity, unspecified    Other abnormal glucose    Paroxysmal atrial fibrillation (HCC)    Postmenopausal bleeding    Pre-diabetes    Right ovarian cyst     Past Surgical History: Past Surgical History:  Procedure Laterality Date   COLONOSCOPY  09/23/2012   CYSTOSCOPY  07/29/2023   Procedure: CYSTOSCOPY;  Surgeon: Mancil Barter, MD;  Location: [facility] ORS;  Service: Gynecology;;   DILATION AND CURETTAGE OF UTERUS     SIGMOIDOSCOPY  09/18/1999   TOTAL LAPAROSCOPIC HYSTERECTOMY WITH BILATERAL SALPINGO OOPHORECTOMY N/A 07/29/2023   Procedure: HYSTERECTOMY, TOTAL, LAPAROSCOPIC, WITH BILATERAL SALPINGO-OOPHORECTOMY; PELVIC WASHINGS;  Surgeon: Mancil Barter, MD;  Location: [facility] ORS;  Service: Gynecology;  Laterality: N/A;   TUBAL LIGATION  1995    Family History: Family History  Problem Relation Age of Onset   Hypertension Father    Clotting disorder Maternal Uncle    Clotting disorder Paternal Grandfather     Social History: Social History   Socioeconomic History   Marital status: Widowed    Spouse name: Not on file   Number of children: 2   Years of education: Not on file   Highest education level: Not on file  Occupational History   Not on file  Tobacco Use   Smoking status: Never   Smokeless tobacco: Never  Vaping Use   Vaping status: Never Used  Substance and Sexual Activity   Alcohol use: No   Drug use: No   Sexual activity: Yes  Other Topics Concern   Not on file  Social History Narrative   Son lives with her   Social Drivers of Health   Financial Resource Strain: Low Risk  (06/24/2023)   Received [facility]   Overall Financial Resource Strain (CARDIA)    Difficulty of Paying Living  Expenses: Not hard at all  Food Insecurity: No Food Insecurity (06/24/2023)   Received [facility]   Hunger Vital Sign    Within the past 12 months, you worried that your food would run out before you got the money to buy more.: Never true    Within the past 12 months, the food you bought just didn't last and you didn't have money to get more.: Never true  Transportation Needs: No Transportation Needs (06/24/2023)   Received from [facility] - Transportation    In the past 12 months, has lack of transportation kept you from medical appointments or from getting medications?: No    Lack of Transportation (Non-Medical): No  Physical Activity: Not on file  Stress: Not on file  Social Connections: Not on file  Intimate Partner Violence: Not on file    Allergies: Allergies  Allergen Reactions   Hydrochlorothiazide Other (See Comments)    Chest Pain    Current Medications: Current Outpatient Medications  Medication Sig Dispense Refill   chlorthalidone (HYGROTON) 25 MG tablet Take 25 mg by mouth daily.     diltiazem  (CARDIZEM  CD) 240 MG 24 hr capsule Take 240 mg by mouth daily.     diltiazem  (CARDIZEM ) 30 MG tablet Take 1 tablet (30 mg total) by mouth daily as needed (Take as needed for atrial fibrillation and heart rate above 110 that resists your long-acting medicine). 30 tablet 0   escitalopram (LEXAPRO) 10 MG tablet Take 10 mg by mouth daily.     lisinopril (PRINIVIL,ZESTRIL) 40 MG tablet Take 40 mg by mouth daily.     pantoprazole (PROTONIX) 40 MG tablet Take 40 mg by mouth daily.     celecoxib (CELEBREX) 200 MG capsule Take 200 mg by mouth daily. (Patient not taking: Reported on 12/09/2023)     Cholecalciferol (VITAMIN D3 PO) Take 1,000 mcg by mouth daily. (Patient not taking: Reported on 12/09/2023)     No current facility-administered medications for this visit.    General: negative for fevers, changes in weight or night  sweats Skin: negative for changes in moles or sores or rash Eyes: negative for changes in vision HEENT: negative for change in hearing, tinnitus, voice changes Pulmonary: negative for dyspnea, orthopnea, productive cough, wheezing Cardiac: negative for palpitations, pain Gastrointestinal: negative for nausea, vomiting, constipation, diarrhea, hematemesis, hematochezia Genitourinary/Sexual: negative for dysuria, retention, hematuria, incontinence Ob/Gyn:  per HPI Musculoskeletal: negative for pain, joint pain, back pain Hematology: negative for easy bruising, abnormal bleeding Neurologic/Psych: negative for headaches, seizures, paralysis, weakness, numbness  Objective:  Physical Examination:  Vitals:   12/09/23 0923  BP: 136/69  Pulse: 85  Resp: 20  Temp: 98.6 F (37 C)  SpO2: 100%  Weight: 297 lb 1.6 oz (134.8 kg)      ECOG Performance Status: 0 - Asymptomatic  General appearance: alert, cooperative, and appears stated age HEENT: mucus membranes moist no mucosal lesions posterior pharynx benign PERRL EOMI sclera clear Neck: no thyroid  enlargement or cervical adenopathy Lymph node survey: non-palpable,  axillary, inguinal, supraclavicular Cardiovascular: without murmurs, rubs or gallops Respiratory: clear to auscultation Abdomen: no palpable masses, no hernias, well healed incision, soft, nontender, nondistended, bowel sounds present, and without hepatosplenomegaly Incisions healing well.  Back: inspection of back is normal Extremities: no lower extremity edema Skin exam - normal coloration and turgor, no rashes, no suspicious skin lesions noted. Neurological exam reveals alert, oriented, normal speech, no focal findings or movement disorder noted.  Pelvic: Exam chaperoned by RN EGBUS: no lesions Vagina: cuff healing well Adnexa: no palpable masses Rectovaginal: confirmatory  Lab Review   Lab Results  Component Value Date   WBC 7.0 11/02/2023   HGB 10.8 (L)  11/02/2023   HCT 34.2 (L) 11/02/2023   MCV 79.4 (L) 11/02/2023   PLT 357 11/02/2023     Chemistry      Component Value Date/Time   NA 137 07/24/2023 0844   NA 140 01/04/2014 1214   K 3.7 07/24/2023 0844   K 3.9 01/04/2014 1214   CL 100 07/24/2023 0844   CL 105 01/04/2014 1214   CO2 23 07/24/2023 0844   CO2 27 01/04/2014 1214   BUN 26 (H) 07/24/2023 0844   BUN 10 01/04/2014 1214   CREATININE 0.97 07/24/2023 0844   CREATININE 0.93 01/04/2014 1214      Component Value Date/Time   CALCIUM 9.5 07/24/2023 0844   CALCIUM 9.0 01/04/2014 1214   ALKPHOS 59 07/24/2023 0844   AST 16 07/24/2023 0844   ALT 13 07/24/2023 0844   BILITOT 0.7 07/24/2023 0844       Assessment:  KIMBER FRITTS is a 53 y.o. female with PMB and PAP showing atypical glandular cells.  US  showed endometrial fluid, thickened endometrial stripe and 2.5cm complex R ovarian cyst.  Endometrial biopsy showed grade 1 endometrial adenocarcinoma. Colposcopy with ECC and cervical biopsies done in view of abnormal PAP and showed fragments of grade 1 adenocarcinoma, but cervix appears normal.  CT scan 2/25 showed 3 cm lower uterine segment mass with no metastatic disease.  TLH/BSO and SLN mapping and biopsies at [facility] on 07/29/23.  Pathology showed 15/17 mm invasion of the myometrium and 2/3 cervical stromal invasion.  Positive LVSI and focal angiolymphatic invasion involving the right fallopian tube. Negative SLN.  She received adjuvant pelvic radiation and vaginal brachytherapy and completed 7/25.  NED.   TP53 WT, MMR IHC intact.   Atrial fibrillation, but normal rhythm on diltiazem .  Medical co-morbidities complicating care: intermittent atrial fibrillation, HTN, BMI 50, prediabetic HgbA1c slightly elevated.  Plan:   Problem List Items Addressed This Visit       Genitourinary   Endometrial cancer (HCC) - Primary    Will follow up with Dr Lenn in radiation oncology in one month and with us  in 4 months. She will RTC sooner  if any concerning symptoms arise.     Prentice Agent, MD   CC:  [facility] [address] [place],  [place] [address] [phone]

## 2023-12-17 ENCOUNTER — Other Ambulatory Visit: Payer: Self-pay | Admitting: Sports Medicine

## 2023-12-17 DIAGNOSIS — M1611 Unilateral primary osteoarthritis, right hip: Secondary | ICD-10-CM

## 2023-12-17 DIAGNOSIS — M76891 Other specified enthesopathies of right lower limb, excluding foot: Secondary | ICD-10-CM

## 2023-12-17 DIAGNOSIS — M25551 Pain in right hip: Secondary | ICD-10-CM

## 2023-12-22 ENCOUNTER — Ambulatory Visit
Admission: RE | Admit: 2023-12-22 | Discharge: 2023-12-22 | Disposition: A | Discharge status: Home / Self Care | Source: Ambulatory Visit | Attending: Sports Medicine | Admitting: Sports Medicine

## 2023-12-22 DIAGNOSIS — M25551 Pain in right hip: Secondary | ICD-10-CM | POA: Diagnosis present

## 2023-12-22 DIAGNOSIS — M76891 Other specified enthesopathies of right lower limb, excluding foot: Secondary | ICD-10-CM | POA: Insufficient documentation

## 2023-12-22 DIAGNOSIS — M1611 Unilateral primary osteoarthritis, right hip: Secondary | ICD-10-CM | POA: Diagnosis present

## 2023-12-28 ENCOUNTER — Encounter: Payer: Self-pay | Admitting: Radiation Oncology

## 2023-12-28 ENCOUNTER — Ambulatory Visit
Admission: RE | Admit: 2023-12-28 | Discharge: 2023-12-28 | Disposition: A | Discharge status: Home / Self Care | Source: Ambulatory Visit | Attending: Radiation Oncology | Admitting: Radiation Oncology

## 2023-12-28 VITALS — BP 124/83 | HR 77 | Temp 97.1°F | Resp 16 | Wt 297.0 lb

## 2023-12-28 DIAGNOSIS — C541 Malignant neoplasm of endometrium: Secondary | ICD-10-CM

## 2023-12-28 NOTE — Progress Notes (Signed)
 Radiation Oncology Follow up Note  Name: Rebecca Compton   Date:   12/28/2023 MRN:  [id number] DOB: Jul 01, 1970    This 53 y.o. female presents to the clinic today for 1 month follow-up.  Status post IMRT radiation therapy to pelvis plus vaginal brachytherapy for pathologic stage II (pT2 N0 M0) endometrial carcinoma status post TLH/BSO and SLN mapping  REFERRING PROVIDER: [facility]  HPI: Patient is a 53 year old female now at 1 month having completed both external beam IMRT radiation therapy to her pelvis as well as vaginal brachytherapy for stage II FIGO grade 1 endometrial carcinoma..  She is seen today in routine follow-up and is doing well specifically denies any increased lower urinary tract symptoms diarrhea or fatigue she did have some diarrhea right after her treatments were complete that has resolved.  We did do IMRT treatment to her pelvis she did have positive LVSI and focal angiolymphatic invasion involving the right fallopian tube.  Her sentinel lymph nodes were negative.  She had a recent pelvic exam by Dr. Mancil the end of July which was negative.  COMPLICATIONS OF TREATMENT: none  FOLLOW UP COMPLIANCE: keeps appointments   PHYSICAL EXAM:  BP 124/83   Pulse 77   Temp (!) 97.1 F (36.2 C)   Resp 16   Wt 297 lb (134.7 kg)   LMP 05/28/2020 (Approximate) Comment: Rarely has periods  BMI 49.42 kg/m  Well-developed well-nourished patient in NAD. HEENT reveals PERLA, EOMI, discs not visualized.  Oral cavity is clear. No oral mucosal lesions are identified. Neck is clear without evidence of cervical or supraclavicular adenopathy. Lungs are clear to A&P. Cardiac examination is essentially unremarkable with regular rate and rhythm without murmur rub or thrill. Abdomen is benign with no organomegaly or masses noted. Motor sensory and DTR levels are equal and symmetric in the upper and lower extremities. Cranial nerves II through XII are grossly intact. Proprioception is intact.  No peripheral adenopathy or edema is identified. No motor or sensory levels are noted. Crude visual fields are within normal range.  RADIOLOGY RESULTS: No current films to review  PLAN: Present time patient continues to do well 1 month out from external beam radiation therapy and vaginal brachytherapy.  She has a very low side effect profile.  I have asked to see her back in 6 months for follow-up.  She continues close follow-up care with GYN on collagen.  Patient knows to call with any concerns.  I would like to take this opportunity to thank you for allowing me to participate in the care of your patient.SABRA Marcey Penton, MD

## 2024-03-23 ENCOUNTER — Encounter: Admission: RE | Payer: Self-pay | Source: Home / Self Care

## 2024-03-23 ENCOUNTER — Ambulatory Visit: Admission: RE | Admit: 2024-03-23 | Source: Home / Self Care | Admitting: Internal Medicine

## 2024-03-23 SURGERY — COLONOSCOPY
Anesthesia: General

## 2024-04-13 ENCOUNTER — Ambulatory Visit

## 2024-04-27 ENCOUNTER — Inpatient Hospital Stay: Attending: Obstetrics and Gynecology | Admitting: Obstetrics and Gynecology

## 2024-04-27 VITALS — BP 119/65 | HR 99 | Temp 96.0°F | Ht 65.0 in | Wt 264.0 lb

## 2024-04-27 DIAGNOSIS — Z8542 Personal history of malignant neoplasm of other parts of uterus: Secondary | ICD-10-CM | POA: Diagnosis not present

## 2024-04-27 DIAGNOSIS — C541 Malignant neoplasm of endometrium: Secondary | ICD-10-CM

## 2024-04-27 DIAGNOSIS — Z90710 Acquired absence of both cervix and uterus: Secondary | ICD-10-CM | POA: Insufficient documentation

## 2024-04-27 DIAGNOSIS — Z90722 Acquired absence of ovaries, bilateral: Secondary | ICD-10-CM | POA: Diagnosis not present

## 2024-04-27 DIAGNOSIS — Z923 Personal history of irradiation: Secondary | ICD-10-CM | POA: Insufficient documentation

## 2024-04-27 DIAGNOSIS — Z9079 Acquired absence of other genital organ(s): Secondary | ICD-10-CM | POA: Diagnosis not present

## 2024-04-27 NOTE — Progress Notes (Signed)
 Gynecologic Oncology Interval Visit   Referring Provider: Dr Beverli Schermerhorn  Chief Complaint: Grade 1 endometrial adenocarcinoma, surveillance  Subjective:  Rebecca Compton is a 53 y.o. P2 female who is seen in consultation from Dr. Maryl Clinic for endometrial adenocarcinoma.   She recently saw Dr. Camelia on 12/28/2023 and had a reassuring exam. She is scheduled to go see Dr Beverli Dinsmore in February 2026.   Oncology history Patient has history of obseity, htn, hld, uc, prediabetic, intermittent pAfib on diltiazem , who presented to gynecology for PMB. No history of HRT since menopause age 41.  NSVD x 2.  Pelvic ultrasound 07/07/23 11x7x5cm uterus, fluid filled endometrium measuring 6.57mm vs 10.10mm (including fluid), 2.5cm complex R ovarian cyst   Pap History: 07/19/15 NILM with neg HRHPV 04/24/21 NILM  06/24/23 AGC with neg HRHPV   EMB, colpo with biopsies and ECC on 07/07/23 Cervix looked normal. Part A-Cervical Biopsy,3:00- FRAGMENTS OF ADENOCARCINOMA, CONSISTENT WITH ENDOMETRIOID ADENOCARCINOMA (FIGO I) WITH SQUAMOUS METAPLASIA.   Part B-Cervical Biopsy,6:00- BENIGN ECTOCERVICAL TISSUE. NEGATIVE FOR DYSPLASIA AND MALIGNANCY. TRANSFORMATION ZONE NOT REPRESENTED.   Part C-Cervical Biopsy,9:00- MINUTE FRAGMENTS OF BENIGN ENDOCERVICAL GLANDS, SQUAMOUS EPITHELIUM, BLOOD, AND MUCUS. EXTREMELY SCANTY. NO DYSPLASIA.   Part D-Cervical Biopsy,10:00- BENIGN ECTOCERVICAL TISSUE, MIXED WITH FRAGMENTS OF ADENOCARCINOMA, CONSISTENT WITH ENDOMETRIOID ADENOCARCINOMA (FIGO I).   Part E-Cervical Biopsy,12:00- MILD CHRONIC CERVICITIS AND FRAGMENTS OF  ADENOCARCINOMA WITH SQUAMOUS METAPLASIA, CONSISTENT WITH  ENDOMETRIOID ADENOCARCINOMA (FIGO I).  Part F-cervical tissue/clot ,Cervical Biopsy- FRAGMENTS OF ADENOCARCINOMA, WITH SQUAMOUS METAPLASIA, CONSISTENT WITH ENDOMETRIOID ADENOCARCINOMA (FIGO I), MIXED WITH MINUTE FRAGMENTS OF BENIGN  ENDOCERVICAL GLANDS, SQUAMOUS EPITHELIUM, BLOOD, AND  MUCUS.  Part G-Endometrial Biopsy - ENDOMETRIOID ADENOCARCINOMA (FIGO I) WITH SQUAMOUS METAPLASIA.   Part H-Endocervical Curettings- FRAGMENTS OF ADENOCARCINOMA, WITH SQUAMOUS METAPLASIA, CONSISTENT WITH ENDOMETRIOID ADENOCARCINOMA (FIGO I).   CT scan 07/17/23  IMPRESSION: 1. Uterine heterogeneity with a 3.1 cm mass arising from the lower uterine segment, compatible with patient's known endometrial adenocarcinoma. 2. Fullness of the left-greater-than-right ovaries, nonspecific consider further evaluation with pelvic ultrasound. 3. No evidence of metastatic disease within the chest, abdomen or pelvis. 4. Patchy ground-glass opacity in the paramedian right lower lobe, likely infectious or inflammatory. 5. Esophageal wall thickening slightly anteriorly asymmetric in the distal esophagus, nonspecific but possibly reflecting esophagitis. Consider further evaluation with endoscopy. 6. Diffuse hepatic steatosis.   TLH/BSO and SLN mapping and biopsies at [facility] on 07/29/23.    FINAL DIAGNOSIS       1. Lymph node, sentinel, biopsy, left external iliac :      - THREE LYMPH NODE NEGATIVE FOR MALIGNANCY (0/3).       2. Lymph node, sentinel, biopsy, right external iliac :      - THREE LYMPH NODE NEGATIVE FOR MALIGNANCY (0/3).       3. Uterus +/- tubes/ovaries, neoplastic, and ovaries :      - ENDOMETRIOID CARCINOMA.      - SEE CANCER SUMMARY BELOW.      - ENDOCERVIX WITH NABOTHIAN CYSTS.      - MYOMETRIUM WITH LEIOMYOMATA, LARGEST MEASURING 3.5 CM.      - BILATERAL FALLOPIAN TUBES WITH FIMBRIATED END; STATUS POST TUBAL LIGATION.      - BILATERAL OVARIES WITH SIMPLE CYSTS.      - PARATUBAL CYSTS.       Diagnosis Note : Since the carcinoma extensively involves the lower uterine      segment a limited panel of stains was performed.The carcinoma is positive for ER  and PR and demonstrates patchy p16 staining.This pattern of immunoreactivity      supports classification as endometrioid  carcinoma.Pancytokeratin stains were      performed on two sentinel lymph nodes to evaluate two foci of cauterized      tissue.Stain controls worked appropriately.    TUMOR Histologic Type: Endometrioid carcinoma Histologic Grade: FIGO grade 1 Molecular Type: MMR Immunohistochemistry: Intact MMR protein expression p53 Immunohistochemistry: Normal (wild-type) expression     Myometrial Invasion: Present Depth of invasion (millimeters): 15 mm Myometrial thickness (millimeters): 17 mm Percentage of myometrial invasion: 88% Uterine Serosa Involvement: Not identified Cervical Involvement: Cervical stromal invasion Percentage of Cervical Wall Involved: 67% Other Tissue/Organ Involvement: Not identified (other tissue/organ submitted in not involved) Pelvic washings: Malignant cells present Lymphatic and/or Vascular Invasion: Present Greater than or equal to 5 foci  MARGINS Margin Status: All margins negative for carcinoma ADDITIONAL FINDINGS: focal angiolymphatic invasion involving the right fallopian tube   REGIONAL LYMPH NODES Regional Lymph Node Status: All regional lymph nodes negative for tumor cells Lymph Nodes Examined:    09/29/2023-11/18/2023 Adjuvant Radiation WPRT 45 Gy and HDR Ir-192 x 3 18 Gy   Problem List: Patient Active Problem List   Diagnosis Date Noted   Counseling and coordination of care 09/09/2023   Atrial fibrillation (HCC) 07/15/2023   Endometrial cancer (HCC) 07/15/2023   Heart murmur 02/03/2013   Essential hypertension     Past Medical History: Past Medical History:  Diagnosis Date   Anemia    Atrial fibrillation (HCC) 02/16/2019   new onset   Chronic ulcerative colitis (HCC)    Endometrial adenocarcinoma (HCC) 06/2023   Essential hypertension    GERD (gastroesophageal reflux disease)    Heart murmur    Morbid obesity with BMI of 50.0-59.9, adult (HCC)    Obesity, unspecified    Other abnormal glucose    Paroxysmal atrial fibrillation (HCC)     Postmenopausal bleeding    Pre-diabetes    Right ovarian cyst     Past Surgical History: Past Surgical History:  Procedure Laterality Date   COLONOSCOPY  09/23/2012   CYSTOSCOPY  07/29/2023   Procedure: CYSTOSCOPY;  Surgeon: Mancil Barter, MD;  Location: [facility] ORS;  Service: Gynecology;;   DILATION AND CURETTAGE OF UTERUS     SIGMOIDOSCOPY  09/18/1999   TOTAL LAPAROSCOPIC HYSTERECTOMY WITH BILATERAL SALPINGO OOPHORECTOMY N/A 07/29/2023   Procedure: HYSTERECTOMY, TOTAL, LAPAROSCOPIC, WITH BILATERAL SALPINGO-OOPHORECTOMY; PELVIC WASHINGS;  Surgeon: Mancil Barter, MD;  Location: [facility] ORS;  Service: Gynecology;  Laterality: N/A;   TUBAL LIGATION  1995    Family History: Family History  Problem Relation Age of Onset   Hypertension Father    Clotting disorder Maternal Uncle    Clotting disorder Paternal Grandfather     Social History: Social History   Socioeconomic History   Marital status: Widowed    Spouse name: Not on file   Number of children: 2   Years of education: Not on file   Highest education level: Not on file  Occupational History   Not on file  Tobacco Use   Smoking status: Never   Smokeless tobacco: Never  Vaping Use   Vaping status: Never Used  Substance and Sexual Activity   Alcohol use: No   Drug use: No   Sexual activity: Yes  Other Topics Concern   Not on file  Social History Narrative   Son lives with her   Social Drivers of Health   Financial Resource Strain: Low Risk  (03/01/2024)  Received from [facility]   Overall Financial Resource Strain (CARDIA)    Difficulty of Paying Living Expenses: Not hard at all  Food Insecurity: No Food Insecurity (03/01/2024)   Received [facility]   Hunger Vital Sign    Within the past 12 months, you worried that your food would run out before you got the money to buy more.: Never true    Within the past 12 months, the food you bought just didn't last and you  didn't have money to get more.: Never true  Transportation Needs: No Transportation Needs (03/01/2024)   Received from [facility] - Transportation    In the past 12 months, has lack of transportation kept you from medical appointments or from getting medications?: No    Lack of Transportation (Non-Medical): No  Physical Activity: Not on file  Stress: Not on file  Social Connections: Not on file  Intimate Partner Violence: Not on file    Allergies: Allergies  Allergen Reactions   Hydrochlorothiazide Other (See Comments)    Chest Pain   Review of systems General: no complaints  HEENT: no complaints  Lungs: no complaints  Cardiac: no complaints  GI: no complaints  GU: no complaints  Musculoskeletal: no complaints  Extremities: no complaints  Skin: no complaints  Neuro: no complaints  Endocrine: no complaints  Psych: no complaints      Objective:  Physical Examination:  BP 119/65 (BP Location: Left Arm, Patient Position: Sitting)   Pulse 99   Temp (!) 96 F (35.6 C) (Tympanic)   Ht 5' 5 (1.651 m)   Wt 264 lb (119.7 kg)   LMP 05/28/2020 (Approximate) Comment: Rarely has periods  SpO2 100%   BMI 43.93 kg/m   ECOG Performance Status: 0 - Asymptomatic  GENERAL: Patient is a well appearing female in no acute distress HEENT:  Atraumatic and normocephalic.  NODES:  No cervical, supraclavicular, axillary, or inguinal lymphadenopathy palpated.  LUNGS: Normal respiratory effort ABDOMEN:  Soft, nontender. Nondistended. No masses/ascites/hernia/or hepatomegaly.  EXTREMITIES:  No peripheral edema.   SKIN: Well-healed incisions NEURO:  Nonfocal. Well oriented.  Appropriate affect.  Pelvic: EGBUS: no lesions Cervix: surgically absent Vagina: no lesions, no discharge or bleeding.  Radiation changes noted at the apex.  Foreshortening of the vaginal vault. Uterus: surgically absent BME: no palpable masses Rectovaginal: deferred   Lab Review   N/A    Assessment:  TANEE HENERY is a 53 y.o. female with Grade 1 endometrial adenocarcinoma s/p TLH/BSO and SLN mapping and biopsies at [facility] on 07/29/23.  Pathology showed 15/17 mm invasion of the myometrium and 2/3 cervical stromal invasion.  Positive LVSI and focal angiolymphatic invasion involving the right fallopian tube. Negative SLN.  She received adjuvant pelvic radiation and vaginal brachytherapy and completed 7/25.  NED.   TP53 WT, MMR IHC intact.   Atrial fibrillation, but normal rhythm on diltiazem .  Medical co-morbidities complicating care: intermittent atrial fibrillation, HTN, BMI 50, prediabetic HgbA1c slightly elevated.  Plan:   Problem List Items Addressed This Visit       Genitourinary   Endometrial cancer [facility]) - Primary     Will follow up with Dr Lenn in radiation oncology in 06/29/2024 and Dr. Lovetta and then RTC with us  in 6 months. She will RTC sooner if any concerning symptoms arise.     Laurana Magistro Isidor Constable, MD

## 2024-05-04 ENCOUNTER — Other Ambulatory Visit: Payer: Self-pay | Admitting: Orthopedic Surgery

## 2024-05-16 ENCOUNTER — Other Ambulatory Visit: Payer: Self-pay

## 2024-05-16 ENCOUNTER — Encounter
Admission: RE | Admit: 2024-05-16 | Discharge: 2024-05-16 | Disposition: A | Discharge status: Home / Self Care | Source: Ambulatory Visit | Attending: Orthopedic Surgery | Admitting: Orthopedic Surgery

## 2024-05-16 DIAGNOSIS — Z01812 Encounter for preprocedural laboratory examination: Secondary | ICD-10-CM

## 2024-05-16 DIAGNOSIS — M25551 Pain in right hip: Secondary | ICD-10-CM

## 2024-05-16 HISTORY — DX: Anxiety disorder, unspecified: F41.9

## 2024-05-16 HISTORY — DX: Unspecified osteoarthritis, unspecified site: M19.90

## 2024-05-16 HISTORY — DX: Pneumonia, unspecified organism: J18.9

## 2024-05-16 NOTE — Progress Notes (Signed)
 Cardiac Clearance from Dr Dewane from 02-24-24:  Dewane Shiner, DO - 02/24/2024 10:30 AM EDT Formatting of this note is different from the original. Established Patient Visit  Chief Complaint: Chief Complaint Patient presents with Atrial Fibrillation Follow-up Date of Service: 02/24/2024 Date of Birth: 1971/05/05 PCP: Marikay Eva Hausen, PA History of Present Illness: Rebecca Compton is a 53 y.o.female patient who returns for  1. Paroxysmal atrial fibrillation 2. Essential hypertension 3. Chronic ulcerative colitis  History of Present Illness Rebecca Compton is a 54 year old female with atrial fibrillation and hypertension who presents for cardiovascular follow-up.  She has a significant medical history this year, including a full hysterectomy in March due to endometrial carcinoma, followed by a month of radiation treatments. She is also planning a right hip replacement in December due to severe hip pain.  She is currently on a weight loss journey, having reduced her weight from 305 lbs to 286 lbs, with the goal of losing enough weight to undergo hip surgery.  Her current medications include diltiazem  (Cardizem ), lisinopril, chlorthalidone, pantoprazole, Lexapro, and Celebrex once daily for hip pain.  She mentions an episode of unusually low blood pressure recorded at a wellness visit, which she doubts was accurate, as it was significantly lower than her usual readings.  She experiences occasional sensations of skipped heartbeats, which resolve quickly and do not cause prolonged discomfort.  Past Medical and Surgical History Past Medical History Past Medical History: Diagnosis Date Endometrial cancer (CMS/HHS-HCC) 07/29/2023 Hypertension Murmur, cardiac Ulcerative colitis, chronic (CMS/HHS-HCC)  Past Surgical History She has a past surgical history that includes Sigmoidoscopy (09/18/1999); Colonoscopy (09/23/2012); Laparoscopic tubal ligation; Dilation and curettage, diagnostic /  therapeutic; and Hysterectomy (07/29/2023).  Medications and Allergies Current Medications  Current Outpatient Medications Medication Sig Dispense Refill celecoxib (CELEBREX) 200 MG capsule Take 100 mg by mouth once daily chlorthalidone 25 MG tablet Take 1 tablet (25 mg total) by mouth once daily 30 tablet 5 dilTIAZem  (CARDIZEM  CD) 240 MG CD capsule Take 1 capsule (240 mg total) by mouth once daily 90 capsule 3 diltiazem  (CARDIZEM ) 30 MG tablet Take by mouth escitalopram oxalate (LEXAPRO) 10 MG tablet TAKE 1 TABLET BY MOUTH DAILY 90 tablet 1 pantoprazole (PROTONIX) 40 MG DR tablet TAKE 1 TABLET BY MOUTH DAILY 30 tablet 5 cyanocobalamin (VITAMIN B12) 100 MCG tablet Take 100 mcg by mouth once daily (Patient not taking: Reported on 02/24/2024) ibuprofen  (MOTRIN ) 200 MG tablet Take 200 mg by mouth every 6 (six) hours as needed for Pain (Patient not taking: Reported on 02/24/2024) lisinopriL (ZESTRIL) 20 MG tablet Take 1 tablet (20 mg total) by mouth once daily 90 tablet 3 sodium, potassium, and magnesium (SUPREP) oral solution Take 1 Bottle by mouth as directed One kit contains 2 bottles. Take both bottles at the times instructed by your provider. 354 mL 0 VITAMIN D3 ORAL Take 1,000 mg by mouth once daily (Patient not taking: Reported on 02/24/2024)  No current facility-administered medications for this visit.  Allergies: Hydrochlorothiazide  Social and Family History Social History reports that she has never smoked. She has never used smokeless tobacco. She reports that she does not drink alcohol and does not use drugs.  Family History Family History Problem Relation Name Age of Onset Colon polyps Mother Inocente Overland Diabetes type II Mother Inocente Overland Colon cancer Maternal Grandfather Rectal cancer Maternal Grandfather Breast cancer Other Hyperlipidemia (Elevated cholesterol) Father  Review of Systems  Review of Systems: The patient denies chest pain, shortness of breath,  orthopnea, paroxysmal nocturnal  dyspnea, pedal edema, palpitations, heart racing, presyncope, syncope. Review of 12 Systems is negative except as described above.  Physical Examination  Vitals:BP 110/80  Pulse 77  Ht 165.1 cm (5' 5)  Wt (!) 129.7 kg (286 lb)  LMP 09/06/2019 (Exact Date)  SpO2 97%  BMI 47.59 kg/m Ht:165.1 cm (5' 5) Wt:(!) 129.7 kg (286 lb) ADJ:Anib surface area is 2.44 meters squared. Body mass index is 47.59 kg/m.  HEENT: Pupils equally reactive to light and accomodation Neck: Supple without thyromegaly, carotid pulses 2+ Lungs: clear to auscultation bilaterally; no wheezes, rales, rhonchi Heart: Regular rate and rhythm. No gallops, murmurs or rub Abdomen: soft nontender, nondistended, with normal bowel sounds Extremities: no cyanosis, clubbing, or edema Peripheral Pulses: 2+ in all extremities, 2+ femoral pulses bilaterally  Assessment  53 y.o. female with 1. Essential hypertension 2. Paroxysmal atrial fibrillation (CMS/HHS-HCC)  53 year old female with paroxysmal atrial fibrillation, CHA2DS2-VASc score of 2, does not meet criteria for OAC. The patient has essential hypertension, blood pressure well-controlled on current BP medications.  Plan  Assessment & Plan Paroxysmal atrial fibrillation She reports occasional palpitations, described as a skip, resolving spontaneously. No current AFib episodes detected during the visit. - Clearance given for upcoming right hip replacement surgery from a cardiovascular standpoint.  Essential hypertension Hypertension is well-controlled with the current medication regimen. Recent blood pressure readings have been lower than usual, suggesting potential overmedication. A specific instance of hypotension is believed to be an anomaly. - Reduce lisinopril dosage from 40 mg to 20 mg daily. - Provide a new prescription for lisinopril at the reduced dosage. - Monitor blood pressure and consider further reduction or  discontinuation of lisinopril at the next visit.  No orders of the defined types were placed in this encounter.  Return in about 6 months (around 08/24/2024).  [name], DO   Electronically signed by Custovic, Sabina, DO at 02/24/2024 10:57 AM EDT

## 2024-05-16 NOTE — Patient Instructions (Addendum)
 Your procedure is scheduled on: 05/23/24 - Monday Report to the Registration Desk on the 1st floor of the [facility]. To find out your arrival time, please call [phone] between 1PM - 3PM on: 05/20/24 - Friday If your arrival time is 6:00 am, do not arrive before that time as the [facility] entrance doors do not open until 6:00 am.  REMEMBER: Instructions that are not followed completely may result in serious medical risk, up to and including death; or upon the discretion of your surgeon and anesthesiologist your surgery may need to be rescheduled.  Do not eat food after midnight the night before surgery.  No gum chewing or hard candies.  You may however, drink CLEAR liquids up to 2 hours before you are scheduled to arrive for your surgery. Do not drink anything within 2 hours of your scheduled arrival time.  Clear liquids include: - water  - apple juice without pulp - gatorade (not RED colors) - black coffee or tea (Do NOT add milk or creamers to the coffee or tea) Do NOT drink anything that is not on this list.  In addition, your doctor has ordered for you to drink the provided:  Ensure Pre-Surgery Clear Carbohydrate Drink  Drinking this carbohydrate drink up to two hours before surgery helps to reduce insulin resistance and improve patient outcomes. Please complete drinking 2 hours before scheduled arrival time.  One week prior to surgery: Stop Anti-inflammatories (NSAIDS) such as Advil , Aleve, Ibuprofen , Motrin , Naproxen, Naprosyn and Aspirin  based products such as Excedrin, Goody's Powder, BC Powder. You may take Tylenol  if needed for pain up until the day of surgery.  Stop ANY OVER THE COUNTER supplements until after surgery.  tirzepatide (ZEPBOUND) hold 7 days days prior to surgery.  ON THE DAY OF SURGERY ONLY TAKE THESE MEDICATIONS WITH SIPS OF WATER:  diltiazem  (CARDIZEM  CD)  escitalopram (LEXAPRO)  pantoprazole (PROTONIX)    No Alcohol for 24 hours before or  after surgery.  No Smoking including e-cigarettes for 24 hours before surgery.  No chewable tobacco products for at least 6 hours before surgery.  No nicotine patches on the day of surgery.  Do not use any recreational drugs for at least a week (preferably 2 weeks) before your surgery.  Please be advised that the combination of cocaine and anesthesia may have negative outcomes, up to and including death. If you test positive for cocaine, your surgery will be cancelled.  On the morning of surgery brush your teeth with toothpaste and water, you may rinse your mouth with mouthwash if you wish. Do not swallow any toothpaste or mouthwash.  Use CHG Soap or wipes as directed on instruction sheet.  Do not wear jewelry, make-up, hairpins, clips or nail polish.  For welded (permanent) jewelry: bracelets, anklets, waist bands, etc.  Please have this removed prior to surgery.  If it is not removed, there is a chance that hospital personnel will need to cut it off on the day of surgery.  Do not wear lotions, powders, or perfumes.   Do not shave body hair from the neck down 48 hours before surgery.  Contact lenses, hearing aids and dentures may not be worn into surgery.  Do not bring valuables to the hospital. [facility] is not responsible for any missing/lost belongings or valuables.   Notify your doctor if there is any change in your medical condition (cold, fever, infection).  Wear comfortable clothing (specific to your surgery type) to the hospital.  After surgery,  you can help prevent lung complications by doing breathing exercises.  Take deep breaths and cough every 1-2 hours. Your doctor may order a device called an Incentive Spirometer to help you take deep breaths.  If you are being admitted to the hospital overnight, leave your suitcase in the car. After surgery it may be brought to your room.  In case of increased patient census, it may be necessary for you, the patient, to  continue your postoperative care in the Same Day Surgery department.  If you are being discharged the day of surgery, you will not be allowed to drive home. You will need a responsible individual to drive you home and stay with you for 24 hours after surgery.   If you are taking public transportation, you will need to have a responsible individual with you.  Please call the Pre-admissions Testing Dept. at [phone] if you have any questions about these instructions.  Surgery Visitation Policy:  Patients having surgery or a procedure may have two visitors.  Children under the age of 75 must have an adult with them who is not the patient.  Inpatient Visitation:    Visiting hours are 7 a.m. to 8 p.m. Up to four visitors are allowed at one time in a patient room. The visitors may rotate out with other people during the day.  One visitor age 32 or older may stay with the patient overnight and must be in the room by 8 p.m.   Merchandiser, Retail to address health-related social needs:  [email or web address]    Pre-operative 4 CHG Bath Instructions   You can play a key role in reducing the risk of infection after surgery. Your skin needs to be as free of germs as possible. You can reduce the number of germs on your skin by washing with CHG (chlorhexidine  gluconate) soap before surgery. CHG is an antiseptic soap that kills germs and continues to kill germs even after washing.   DO NOT use if you have an allergy to chlorhexidine /CHG or antibacterial soaps. If your skin becomes reddened or irritated, stop using the CHG and notify one of our RNs at [phone].   Please shower with the CHG soap starting 4 days before surgery using the following schedule: 01/01 - 01/04.    Please keep in mind the following:  DO NOT shave, including legs and underarms, starting the day of your first shower.   You may shave your face at any point before/day of surgery.  Place clean  sheets on your bed the day you start using CHG soap. Use a clean washcloth (not used since being washed) for each shower. DO NOT sleep with pets once you start using the CHG.   CHG Shower Instructions:  If you choose to wash your hair and private area, wash first with your normal shampoo/soap.  After you use shampoo/soap, rinse your hair and body thoroughly to remove shampoo/soap residue.  Turn the water OFF and apply about 3 tablespoons (45 ml) of CHG soap to a CLEAN washcloth.  Apply CHG soap ONLY FROM YOUR NECK DOWN TO YOUR TOES (washing for 3-5 minutes)  DO NOT use CHG soap on face, private areas, open wounds, or sores.  Pay special attention to the area where your surgery is being performed.  If you are having back surgery, having someone wash your back for you may be helpful. Wait 2 minutes after CHG soap is applied, then you may rinse off the CHG soap.  Pat dry with  a clean towel  Put on clean clothes/pajamas   If you choose to wear lotion, please use ONLY the CHG-compatible lotions on the back of this paper.     Additional instructions for the day of surgery: DO NOT APPLY any lotions, deodorants, cologne, or perfumes.   Put on clean/comfortable clothes.  Brush your teeth.  Ask your nurse before applying any prescription medications to the skin.      CHG Compatible Lotions   Aveeno Moisturizing lotion  Cetaphil Moisturizing Cream  Cetaphil Moisturizing Lotion  Clairol Herbal Essence Moisturizing Lotion, Dry Skin  Clairol Herbal Essence Moisturizing Lotion, Extra Dry Skin  Clairol Herbal Essence Moisturizing Lotion, Normal Skin  Curel Age Defying Therapeutic Moisturizing Lotion with Alpha Hydroxy  Curel Extreme Care Body Lotion  Curel Soothing Hands Moisturizing Hand Lotion  Curel Therapeutic Moisturizing Cream, Fragrance-Free  Curel Therapeutic Moisturizing Lotion, Fragrance-Free  Curel Therapeutic Moisturizing Lotion, Original Formula  Eucerin Daily Replenishing  Lotion  Eucerin Dry Skin Therapy Plus Alpha Hydroxy Crme  Eucerin Dry Skin Therapy Plus Alpha Hydroxy Lotion  Eucerin Original Crme  Eucerin Original Lotion  Eucerin Plus Crme Eucerin Plus Lotion  Eucerin TriLipid Replenishing Lotion  Keri Anti-Bacterial Hand Lotion  Keri Deep Conditioning Original Lotion Dry Skin Formula Softly Scented  Keri Deep Conditioning Original Lotion, Fragrance Free Sensitive Skin Formula  Keri Lotion Fast Absorbing Fragrance Free Sensitive Skin Formula  Keri Lotion Fast Absorbing Softly Scented Dry Skin Formula  Keri Original Lotion  Keri Skin Renewal Lotion Keri Silky Smooth Lotion  Keri Silky Smooth Sensitive Skin Lotion  Nivea Body Creamy Conditioning Oil  Nivea Body Extra Enriched Lotion  Nivea Body Original Lotion  Nivea Body Sheer Moisturizing Lotion Nivea Crme  Nivea Skin Firming Lotion  NutraDerm 30 Skin Lotion  NutraDerm Skin Lotion  NutraDerm Therapeutic Skin Cream  NutraDerm Therapeutic Skin Lotion  ProShield Protective Hand Cream  Provon moisturizing lotion  How to Use an Incentive Spirometer  An incentive spirometer is a tool that measures how well you are filling your lungs with each breath. Learning to take long, deep breaths using this tool can help you keep your lungs clear and active. This may help to reverse or lessen your chance of developing breathing (pulmonary) problems, especially infection. You may be asked to use a spirometer: After a surgery. If you have a lung problem or a history of smoking. After a long period of time when you have been unable to move or be active. If the spirometer includes an indicator to show the highest number that you have reached, your health care provider or respiratory therapist will help you set a goal. Keep a log of your progress as told by your health care provider. What are the risks? Breathing too quickly may cause dizziness or cause you to pass out. Take your time so you do not get dizzy or  light-headed. If you are in pain, you may need to take pain medicine before doing incentive spirometry. It is harder to take a deep breath if you are having pain. How to use your incentive spirometer  Sit up on the edge of your bed or on a chair. Hold the incentive spirometer so that it is in an upright position. Before you use the spirometer, breathe out normally. Place the mouthpiece in your mouth. Make sure your lips are closed tightly around it. Breathe in slowly and as deeply as you can through your mouth, causing the piston or the ball to rise toward the top  of the chamber. Hold your breath for 3-5 seconds, or for as long as possible. If the spirometer includes a coach indicator, use this to guide you in breathing. Slow down your breathing if the indicator goes above the marked areas. Remove the mouthpiece from your mouth and breathe out normally. The piston or ball will return to the bottom of the chamber. Rest for a few seconds, then repeat the steps 10 or more times. Take your time and take a few normal breaths between deep breaths so that you do not get dizzy or light-headed. Do this every 1-2 hours when you are awake. If the spirometer includes a goal marker to show the highest number you have reached (best effort), use this as a goal to work toward during each repetition. After each set of 10 deep breaths, cough a few times. This will help to make sure that your lungs are clear. If you have an incision on your chest or abdomen from surgery, place a pillow or a rolled-up towel firmly against the incision when you cough. This can help to reduce pain while taking deep breaths and coughing. General tips When you are able to get out of bed: Walk around often. Continue to take deep breaths and cough in order to clear your lungs. Keep using the incentive spirometer until your health care provider says it is okay to stop using it. If you have been in the hospital, you may be told to keep  using the spirometer at home. Contact a health care provider if: You are having difficulty using the spirometer. You have trouble using the spirometer as often as instructed. Your pain medicine is not giving enough relief for you to use the spirometer as told. You have a fever. Get help right away if: You develop shortness of breath. You develop a cough with bloody mucus from the lungs. You have fluid or blood coming from an incision site after you cough. Summary An incentive spirometer is a tool that can help you learn to take long, deep breaths to keep your lungs clear and active. You may be asked to use a spirometer after a surgery, if you have a lung problem or a history of smoking, or if you have been inactive for a long period of time. Use your incentive spirometer as instructed every 1-2 hours while you are awake. If you have an incision on your chest or abdomen, place a pillow or a rolled-up towel firmly against your incision when you cough. This will help to reduce pain. Get help right away if you have shortness of breath, you cough up bloody mucus, or blood comes from your incision when you cough. This information is not intended to replace advice given to you by your health care provider. Make sure you discuss any questions you have with your health care provider. Document Revised: 07/25/2019 Document Reviewed: 07/25/2019 Elsevier Patient Education  2023 Elsevier Inc.  Preoperative Educational Videos for Total Hip, Knee and Shoulder Replacements  To better prepare for surgery, please view our videos that explain the physical activity and discharge planning required to have the best surgical recovery at [facility].  [email or web address]  Questions? Call [phone] or email [email or web address]

## 2024-05-17 ENCOUNTER — Encounter
Admission: RE | Admit: 2024-05-17 | Discharge: 2024-05-17 | Disposition: A | Discharge status: Home / Self Care | Source: Ambulatory Visit | Attending: Orthopedic Surgery | Admitting: Orthopedic Surgery

## 2024-05-17 DIAGNOSIS — Z01818 Encounter for other preprocedural examination: Secondary | ICD-10-CM | POA: Diagnosis not present

## 2024-05-17 DIAGNOSIS — Z01812 Encounter for preprocedural laboratory examination: Secondary | ICD-10-CM

## 2024-05-17 DIAGNOSIS — Z01810 Encounter for preprocedural cardiovascular examination: Secondary | ICD-10-CM | POA: Diagnosis present

## 2024-05-17 LAB — CBC WITH DIFFERENTIAL/PLATELET
Abs Immature Granulocytes: 0.02 K/uL (ref 0.00–0.07)
Basophils Absolute: 0.1 K/uL (ref 0.0–0.1)
Basophils Relative: 1 %
Eosinophils Absolute: 0 K/uL (ref 0.0–0.5)
Eosinophils Relative: 1 %
HCT: 36.1 % (ref 36.0–46.0)
Hemoglobin: 11.4 g/dL — ABNORMAL LOW (ref 12.0–15.0)
Immature Granulocytes: 0 %
Lymphocytes Relative: 14 %
Lymphs Abs: 0.7 K/uL (ref 0.7–4.0)
MCH: 24.8 pg — ABNORMAL LOW (ref 26.0–34.0)
MCHC: 31.6 g/dL (ref 30.0–36.0)
MCV: 78.6 fL — ABNORMAL LOW (ref 80.0–100.0)
Monocytes Absolute: 0.3 K/uL (ref 0.1–1.0)
Monocytes Relative: 7 %
Neutro Abs: 3.7 K/uL (ref 1.7–7.7)
Neutrophils Relative %: 77 %
Platelets: 364 K/uL (ref 150–400)
RBC: 4.59 MIL/uL (ref 3.87–5.11)
RDW: 15.9 % — ABNORMAL HIGH (ref 11.5–15.5)
WBC: 4.8 K/uL (ref 4.0–10.5)
nRBC: 0 % (ref 0.0–0.2)

## 2024-05-17 LAB — COMPREHENSIVE METABOLIC PANEL WITH GFR
ALT: 9 U/L (ref 0–44)
AST: 15 U/L (ref 15–41)
Albumin: 4.3 g/dL (ref 3.5–5.0)
Alkaline Phosphatase: 83 U/L (ref 38–126)
Anion gap: 12 (ref 5–15)
BUN: 15 mg/dL (ref 6–20)
CO2: 28 mmol/L (ref 22–32)
Calcium: 10.2 mg/dL (ref 8.9–10.3)
Chloride: 97 mmol/L — ABNORMAL LOW (ref 98–111)
Creatinine, Ser: 1.02 mg/dL — ABNORMAL HIGH (ref 0.44–1.00)
GFR, Estimated: >60 mL/min
Glucose, Bld: 89 mg/dL (ref 70–99)
Potassium: 3.3 mmol/L — ABNORMAL LOW (ref 3.5–5.1)
Sodium: 137 mmol/L (ref 135–145)
Total Bilirubin: 0.5 mg/dL (ref 0.0–1.2)
Total Protein: 7.7 g/dL (ref 6.5–8.1)

## 2024-05-17 LAB — URINALYSIS, ROUTINE W REFLEX MICROSCOPIC
Bilirubin Urine: NEGATIVE
Glucose, UA: NEGATIVE mg/dL
Ketones, ur: NEGATIVE mg/dL
Nitrite: NEGATIVE
Protein, ur: NEGATIVE mg/dL
Specific Gravity, Urine: 1.02 (ref 1.005–1.030)
pH: 5 (ref 5.0–8.0)

## 2024-05-17 LAB — SURGICAL PCR SCREEN
MRSA, PCR: NEGATIVE
Staphylococcus aureus: POSITIVE — AB

## 2024-05-17 LAB — TYPE AND SCREEN
ABO/RH(D): O POS
Antibody Screen: NEGATIVE

## 2024-05-20 NOTE — Anesthesia Preprocedure Evaluation (Addendum)
"                                    Anesthesia Evaluation  Patient identified by MRN, date of birth, ID band Patient awake    Reviewed: Allergy & Precautions, H&P , NPO status , Patient's Chart, lab work & pertinent test results, reviewed documented beta blocker date and time   History of Anesthesia Complications Negative for: history of anesthetic complications  Airway Mallampati: IV  TM Distance: >3 FB Neck ROM: full    Dental  (+) Dental Advidsory Given, Teeth Intact   Pulmonary neg pulmonary ROS, neg sleep apnea   Pulmonary exam normal breath sounds clear to auscultation       Cardiovascular Exercise Tolerance: Good hypertension, (-) angina (-) Past MI and (-) Cardiac Stents + dysrhythmias Atrial Fibrillation + Valvular Problems/Murmurs  Rhythm:regular Rate:Normal  ECHO 5/25: CONCLUSION -------------------------------------------------------------------------------  NORMAL LEFT VENTRICULAR SYSTOLIC FUNCTION WITH MILD LVH  ESTIMATED EF: 55%, CALC EF(2D): 52%  NORMAL LA PRESSURES WITH NORMAL DIASTOLIC FUNCTION  NORMAL RIGHT VENTRICULAR SYSTOLIC FUNCTION  VALVULAR REGURGITATION: No AR, TRIVIAL MR, TRIVIAL PR, MILD TR  ESTIMATED RVSP: 39 mmHg  NO VALVULAR STENOSIS  ------------------------------------------------------------------------------------------  Prior Echo Study on 02/25/2019  MILD to MODERATE TR  MILD PHTN  EF 55%     Neuro/Psych  PSYCHIATRIC DISORDERS Anxiety     negative neurological ROS     GI/Hepatic Neg liver ROS, PUD,GERD  ,,  Endo/Other  diabetes (borderline)  Class 4 obesity  Renal/GU negative Renal ROS  negative genitourinary   Musculoskeletal  (+) Arthritis ,    Abdominal  (+) + obese  Peds  Hematology  (+) Blood dyscrasia, anemia   Anesthesia Other Findings Past Medical History: No date: Anemia 02/16/2019: Atrial fibrillation (HCC)     Comment:  new onset No date: Chronic ulcerative colitis (HCC) 06/2023: Endometrial  adenocarcinoma (HCC) No date: Essential hypertension No date: GERD (gastroesophageal reflux disease) No date: Heart murmur No date: Morbid obesity with BMI of 50.0-59.9, adult (HCC) No date: Obesity, unspecified No date: Other abnormal glucose No date: Paroxysmal atrial fibrillation (HCC) No date: Postmenopausal bleeding No date: Pre-diabetes No date: Right ovarian cyst   Reproductive/Obstetrics negative OB ROS                              Anesthesia Physical Anesthesia Plan  ASA: 3  Anesthesia Plan: General/Spinal   Post-op Pain Management: Regional block*   Induction: Intravenous  PONV Risk Score and Plan: 3 and Ondansetron , Dexamethasone , Midazolam , Propofol  infusion and TIVA  Airway Management Planned: Natural Airway and Nasal Cannula  Additional Equipment:   Intra-op Plan:   Post-operative Plan:   Informed Consent: I have reviewed the patients History and Physical, chart, labs and discussed the procedure including the risks, benefits and alternatives for the proposed anesthesia with the patient or authorized representative who has indicated his/her understanding and acceptance.     Dental Advisory Given  Plan Discussed with: Anesthesiologist, CRNA and Surgeon  Anesthesia Plan Comments:          Anesthesia Quick Evaluation  "

## 2024-05-22 MED ORDER — CHLORHEXIDINE GLUCONATE 0.12 % MT SOLN
15.0000 mL | Freq: Once | OROMUCOSAL | Status: AC
  Administered 2024-05-23: 15 mL via OROMUCOSAL

## 2024-05-22 MED ORDER — CEFAZOLIN SODIUM-DEXTROSE 2-4 GM/100ML-% IV SOLN
2.0000 g | INTRAVENOUS | Status: AC
  Administered 2024-05-23: 2 g via INTRAVENOUS

## 2024-05-22 MED ORDER — ORAL CARE MOUTH RINSE: 15.0000 mL | Freq: Once | OROMUCOSAL | Status: AC

## 2024-05-22 MED ORDER — TRANEXAMIC ACID-NACL 1000-0.7 MG/100ML-% IV SOLN
1000.0000 mg | INTRAVENOUS | Status: AC
  Administered 2024-05-23 (×2): 1000 mg via INTRAVENOUS

## 2024-05-22 MED ORDER — DEXAMETHASONE SOD PHOSPHATE PF 10 MG/ML IJ SOLN: 8.0000 mg | Freq: Once | INTRAMUSCULAR | Status: DC

## 2024-05-22 MED ORDER — LACTATED RINGERS IV SOLN: INTRAVENOUS | Status: DC

## 2024-05-23 ENCOUNTER — Ambulatory Visit
Admission: RE | Admit: 2024-05-23 | Discharge: 2024-05-24 | Disposition: A | Discharge status: Home Health | Attending: Orthopedic Surgery | Admitting: Orthopedic Surgery

## 2024-05-23 ENCOUNTER — Ambulatory Visit

## 2024-05-23 ENCOUNTER — Other Ambulatory Visit: Payer: Self-pay

## 2024-05-23 ENCOUNTER — Encounter
Admission: RE | Disposition: A | Discharge status: Home Health | Payer: Self-pay | Source: Home / Self Care | Attending: Orthopedic Surgery

## 2024-05-23 ENCOUNTER — Ambulatory Visit: Payer: Self-pay | Admitting: Urgent Care

## 2024-05-23 ENCOUNTER — Encounter: Payer: Self-pay | Admitting: Orthopedic Surgery

## 2024-05-23 ENCOUNTER — Ambulatory Visit: Admitting: Anesthesiology

## 2024-05-23 DIAGNOSIS — I480 Paroxysmal atrial fibrillation: Secondary | ICD-10-CM | POA: Diagnosis not present

## 2024-05-23 DIAGNOSIS — M1611 Unilateral primary osteoarthritis, right hip: Secondary | ICD-10-CM | POA: Diagnosis present

## 2024-05-23 DIAGNOSIS — D649 Anemia, unspecified: Secondary | ICD-10-CM | POA: Diagnosis not present

## 2024-05-23 DIAGNOSIS — Z01812 Encounter for preprocedural laboratory examination: Secondary | ICD-10-CM

## 2024-05-23 DIAGNOSIS — I10 Essential (primary) hypertension: Secondary | ICD-10-CM | POA: Diagnosis not present

## 2024-05-23 DIAGNOSIS — Z96641 Presence of right artificial hip joint: Secondary | ICD-10-CM | POA: Diagnosis present

## 2024-05-23 DIAGNOSIS — Z6841 Body Mass Index (BMI) 40.0 and over, adult: Secondary | ICD-10-CM | POA: Insufficient documentation

## 2024-05-23 DIAGNOSIS — E119 Type 2 diabetes mellitus without complications: Secondary | ICD-10-CM | POA: Insufficient documentation

## 2024-05-23 HISTORY — PX: TOTAL HIP ARTHROPLASTY: SHX124

## 2024-05-23 SURGERY — ARTHROPLASTY, HIP, TOTAL, ANTERIOR APPROACH
Anesthesia: Spinal | Site: Hip | Laterality: Right

## 2024-05-23 MED ORDER — PROPOFOL 500 MG/50ML IV EMUL
INTRAVENOUS | Status: DC | PRN
  Administered 2024-05-23: 80 ug/kg/min via INTRAVENOUS

## 2024-05-23 MED ORDER — MENTHOL 3 MG MT LOZG: 1.0000 | LOZENGE | OROMUCOSAL | Status: DC | PRN

## 2024-05-23 MED ORDER — DOCUSATE SODIUM 100 MG PO CAPS
100.0000 mg | ORAL_CAPSULE | Freq: Two times a day (BID) | ORAL | 0 refills | Status: AC
  Filled 2024-05-23: qty 10, 5d supply, fill #0

## 2024-05-23 MED ORDER — FENTANYL CITRATE (PF) 100 MCG/2ML IJ SOLN: 25.0000 ug | INTRAMUSCULAR | Status: DC | PRN

## 2024-05-23 MED ORDER — OXYCODONE HCL 5 MG/5ML PO SOLN: 5.0000 mg | Freq: Once | ORAL | Status: DC | PRN

## 2024-05-23 MED ORDER — DOCUSATE SODIUM 100 MG PO CAPS
100.0000 mg | ORAL_CAPSULE | Freq: Two times a day (BID) | ORAL | Status: DC
  Administered 2024-05-23 – 2024-05-24 (×2): 100 mg via ORAL
  Filled 2024-05-23 (×2): qty 1

## 2024-05-23 MED ORDER — SURGIPHOR WOUND IRRIGATION SYSTEM - OPTIME: TOPICAL | Status: DC | PRN

## 2024-05-23 MED ORDER — CHLORTHALIDONE 25 MG PO TABS
25.0000 mg | ORAL_TABLET | Freq: Every day | ORAL | Status: DC
  Administered 2024-05-24: 25 mg via ORAL
  Filled 2024-05-23: qty 1

## 2024-05-23 MED ORDER — HYDROCODONE-ACETAMINOPHEN 5-325 MG PO TABS
1.0000 | ORAL_TABLET | ORAL | Status: DC | PRN
  Administered 2024-05-23: 1 via ORAL
  Filled 2024-05-23: qty 1

## 2024-05-23 MED ORDER — FENTANYL CITRATE (PF) 100 MCG/2ML IJ SOLN
INTRAMUSCULAR | Status: DC | PRN
  Administered 2024-05-23: 50 ug via INTRAVENOUS
  Administered 2024-05-23 (×2): 25 ug via INTRAVENOUS

## 2024-05-23 MED ORDER — BUPIVACAINE HCL (PF) 0.5 % IJ SOLN
INTRAMUSCULAR | Status: DC | PRN
  Administered 2024-05-23: 2.5 mL

## 2024-05-23 MED ORDER — DROPERIDOL 2.5 MG/ML IJ SOLN
INTRAMUSCULAR | Status: AC
  Filled 2024-05-23: qty 2

## 2024-05-23 MED ORDER — METOCLOPRAMIDE HCL 5 MG/ML IJ SOLN: 5.0000 mg | Freq: Three times a day (TID) | INTRAMUSCULAR | Status: DC | PRN

## 2024-05-23 MED ORDER — SODIUM CHLORIDE 0.9 % IV SOLN: INTRAVENOUS | Status: DC

## 2024-05-23 MED ORDER — SODIUM CHLORIDE (PF) 0.9 % IJ SOLN
INTRAMUSCULAR | Status: AC
  Filled 2024-05-23: qty 10

## 2024-05-23 MED ORDER — PHENOL 1.4 % MT LIQD: 1.0000 | OROMUCOSAL | Status: DC | PRN

## 2024-05-23 MED ORDER — SODIUM CHLORIDE 0.9 % IR SOLN
Status: DC | PRN
  Administered 2024-05-23: 100 mL

## 2024-05-23 MED ORDER — MIDAZOLAM HCL 2 MG/2ML IJ SOLN
INTRAMUSCULAR | Status: AC
  Filled 2024-05-23: qty 2

## 2024-05-23 MED ORDER — BUPIVACAINE LIPOSOME 1.3 % IJ SUSP
INTRAMUSCULAR | Status: AC
  Filled 2024-05-23: qty 20

## 2024-05-23 MED ORDER — PROPOFOL 10 MG/ML IV BOLUS
INTRAVENOUS | Status: AC
  Filled 2024-05-23: qty 20

## 2024-05-23 MED ORDER — KETOROLAC TROMETHAMINE 15 MG/ML IJ SOLN
INTRAMUSCULAR | Status: AC
  Filled 2024-05-23: qty 1

## 2024-05-23 MED ORDER — TRAMADOL HCL 50 MG PO TABS
50.0000 mg | ORAL_TABLET | Freq: Four times a day (QID) | ORAL | Status: DC | PRN
  Administered 2024-05-23: 50 mg via ORAL
  Filled 2024-05-23: qty 1

## 2024-05-23 MED ORDER — PHENYLEPHRINE 80 MCG/ML (10ML) SYRINGE FOR IV PUSH (FOR BLOOD PRESSURE SUPPORT)
PREFILLED_SYRINGE | INTRAVENOUS | Status: DC | PRN
  Administered 2024-05-23 (×2): 80 ug via INTRAVENOUS

## 2024-05-23 MED ORDER — PHENYLEPHRINE HCL-NACL 20-0.9 MG/250ML-% IV SOLN: INTRAVENOUS | Status: DC | PRN

## 2024-05-23 MED ORDER — LISINOPRIL 20 MG PO TABS
40.0000 mg | ORAL_TABLET | Freq: Every day | ORAL | Status: DC
  Administered 2024-05-24: 40 mg via ORAL

## 2024-05-23 MED ORDER — BUPIVACAINE-EPINEPHRINE (PF) 0.25% -1:200000 IJ SOLN
INTRAMUSCULAR | Status: AC
  Filled 2024-05-23: qty 30

## 2024-05-23 MED ORDER — 0.9 % SODIUM CHLORIDE (POUR BTL) OPTIME
TOPICAL | Status: DC | PRN
  Administered 2024-05-23: 500 mL

## 2024-05-23 MED ORDER — ACETAMINOPHEN 10 MG/ML IV SOLN
INTRAVENOUS | Status: AC
  Filled 2024-05-23: qty 100

## 2024-05-23 MED ORDER — ONDANSETRON HCL 4 MG/2ML IJ SOLN: 4.0000 mg | Freq: Four times a day (QID) | INTRAMUSCULAR | Status: DC | PRN

## 2024-05-23 MED ORDER — EPHEDRINE SULFATE-NACL 50-0.9 MG/10ML-% IV SOSY
PREFILLED_SYRINGE | INTRAVENOUS | Status: DC | PRN
  Administered 2024-05-23: 5 mg via INTRAVENOUS
  Administered 2024-05-23: 10 mg via INTRAVENOUS

## 2024-05-23 MED ORDER — OXYCODONE HCL 5 MG PO TABS
2.5000 mg | ORAL_TABLET | Freq: Four times a day (QID) | ORAL | 0 refills | Status: AC | PRN
  Filled 2024-05-23: qty 30, 7d supply, fill #0

## 2024-05-23 MED ORDER — ENOXAPARIN SODIUM 40 MG/0.4ML IJ SOSY
40.0000 mg | PREFILLED_SYRINGE | INTRAMUSCULAR | Status: DC
  Administered 2024-05-24: 40 mg via SUBCUTANEOUS
  Filled 2024-05-23: qty 0.4

## 2024-05-23 MED ORDER — PROPOFOL 10 MG/ML IV BOLUS
INTRAVENOUS | Status: AC
  Filled 2024-05-23: qty 40

## 2024-05-23 MED ORDER — ACETAMINOPHEN 500 MG PO TABS
1000.0000 mg | ORAL_TABLET | Freq: Three times a day (TID) | ORAL | Status: DC
  Administered 2024-05-23 – 2024-05-24 (×2): 1000 mg via ORAL
  Filled 2024-05-23 (×2): qty 2

## 2024-05-23 MED ORDER — ENOXAPARIN SODIUM 40 MG/0.4ML IJ SOSY
40.0000 mg | PREFILLED_SYRINGE | INTRAMUSCULAR | 0 refills | Status: AC
  Filled 2024-05-23: qty 5.6, 14d supply, fill #0

## 2024-05-23 MED ORDER — DILTIAZEM HCL ER COATED BEADS 240 MG PO CP24
240.0000 mg | ORAL_CAPSULE | Freq: Every day | ORAL | Status: DC
  Administered 2024-05-24: 240 mg via ORAL
  Filled 2024-05-23: qty 1

## 2024-05-23 MED ORDER — ACETAMINOPHEN 325 MG PO TABS: 325.0000 mg | ORAL_TABLET | Freq: Four times a day (QID) | ORAL | Status: DC | PRN

## 2024-05-23 MED ORDER — FENTANYL CITRATE (PF) 100 MCG/2ML IJ SOLN
INTRAMUSCULAR | Status: AC
  Filled 2024-05-23: qty 2

## 2024-05-23 MED ORDER — KETOROLAC TROMETHAMINE 15 MG/ML IJ SOLN
7.5000 mg | Freq: Four times a day (QID) | INTRAMUSCULAR | Status: AC
  Administered 2024-05-23 – 2024-05-24 (×4): 7.5 mg via INTRAVENOUS
  Filled 2024-05-23 (×3): qty 1

## 2024-05-23 MED ORDER — MUPIROCIN 2 % EX OINT
1.0000 | TOPICAL_OINTMENT | Freq: Two times a day (BID) | CUTANEOUS | 0 refills | Status: AC
  Filled 2024-05-23: qty 22, 30d supply, fill #0

## 2024-05-23 MED ORDER — SODIUM CHLORIDE (PF) 0.9 % IJ SOLN
INTRAMUSCULAR | Status: DC | PRN
  Administered 2024-05-23: 50 mL

## 2024-05-23 MED ORDER — CHLORHEXIDINE GLUCONATE 4 % EX SOLN
1.0000 | CUTANEOUS | 1 refills | Status: AC
  Filled 2024-05-23: qty 236, 7d supply, fill #0

## 2024-05-23 MED ORDER — ESCITALOPRAM OXALATE 10 MG PO TABS
10.0000 mg | ORAL_TABLET | Freq: Every day | ORAL | Status: DC
  Administered 2024-05-24: 10 mg via ORAL
  Filled 2024-05-23: qty 1

## 2024-05-23 MED ORDER — CHLORHEXIDINE GLUCONATE 0.12 % MT SOLN
OROMUCOSAL | Status: AC
  Filled 2024-05-23: qty 15

## 2024-05-23 MED ORDER — DROPERIDOL 2.5 MG/ML IJ SOLN
0.6250 mg | Freq: Once | INTRAMUSCULAR | Status: AC | PRN
  Administered 2024-05-23: 0.625 mg via INTRAVENOUS

## 2024-05-23 MED ORDER — CEFAZOLIN SODIUM-DEXTROSE 2-4 GM/100ML-% IV SOLN
INTRAVENOUS | Status: AC
  Filled 2024-05-23: qty 100

## 2024-05-23 MED ORDER — CEFAZOLIN SODIUM-DEXTROSE 2-4 GM/100ML-% IV SOLN
2.0000 g | Freq: Four times a day (QID) | INTRAVENOUS | Status: AC
  Administered 2024-05-23 (×2): 2 g via INTRAVENOUS
  Filled 2024-05-23 (×2): qty 100

## 2024-05-23 MED ORDER — ONDANSETRON HCL 4 MG PO TABS: 4.0000 mg | ORAL_TABLET | Freq: Four times a day (QID) | ORAL | Status: DC | PRN

## 2024-05-23 MED ORDER — TRAMADOL HCL 50 MG PO TABS
50.0000 mg | ORAL_TABLET | Freq: Four times a day (QID) | ORAL | 0 refills | Status: AC | PRN
  Filled 2024-05-23: qty 30, 7d supply, fill #0

## 2024-05-23 MED ORDER — MORPHINE SULFATE (PF) 2 MG/ML IV SOLN: 0.5000 mg | INTRAVENOUS | Status: DC | PRN

## 2024-05-23 MED ORDER — TRANEXAMIC ACID-NACL 1000-0.7 MG/100ML-% IV SOLN
INTRAVENOUS | Status: AC
  Filled 2024-05-23: qty 100

## 2024-05-23 MED ORDER — ACETAMINOPHEN 10 MG/ML IV SOLN
1000.0000 mg | Freq: Once | INTRAVENOUS | Status: DC | PRN
  Administered 2024-05-23: 1000 mg via INTRAVENOUS

## 2024-05-23 MED ORDER — ONDANSETRON HCL 4 MG PO TABS
4.0000 mg | ORAL_TABLET | Freq: Four times a day (QID) | ORAL | 0 refills | Status: AC | PRN
  Filled 2024-05-23: qty 20, 5d supply, fill #0

## 2024-05-23 MED ORDER — METOCLOPRAMIDE HCL 10 MG PO TABS: 5.0000 mg | ORAL_TABLET | Freq: Three times a day (TID) | ORAL | Status: DC | PRN

## 2024-05-23 MED ORDER — MIDAZOLAM HCL 5 MG/5ML IJ SOLN
INTRAMUSCULAR | Status: DC | PRN
  Administered 2024-05-23: 2 mg via INTRAVENOUS

## 2024-05-23 MED ORDER — OXYCODONE HCL 5 MG PO TABS: 5.0000 mg | ORAL_TABLET | Freq: Once | ORAL | Status: DC | PRN

## 2024-05-23 MED ORDER — ACETAMINOPHEN 500 MG PO TABS
1000.0000 mg | ORAL_TABLET | Freq: Three times a day (TID) | ORAL | 0 refills | Status: AC
  Filled 2024-05-23: qty 30, 5d supply, fill #0

## 2024-05-23 MED ORDER — CELECOXIB 100 MG PO CAPS
100.0000 mg | ORAL_CAPSULE | Freq: Two times a day (BID) | ORAL | 0 refills | Status: AC
  Filled 2024-05-23: qty 14, 7d supply, fill #0

## 2024-05-23 MED ORDER — PANTOPRAZOLE SODIUM 40 MG PO TBEC
40.0000 mg | DELAYED_RELEASE_TABLET | Freq: Every day | ORAL | Status: DC
  Administered 2024-05-24: 40 mg via ORAL
  Filled 2024-05-23: qty 1

## 2024-05-23 SURGICAL SUPPLY — 56 items
BLADE CLIPPER SURG (BLADE) IMPLANT
BLADE SAGITTAL AGGR TOOTH XLG (BLADE) ×1 IMPLANT
BNDG COHESIVE 4X5 TAN STRL (GAUZE/BANDAGES/DRESSINGS) ×1 IMPLANT
BNDG COHESIVE 6X5 TAN ST LF (GAUZE/BANDAGES/DRESSINGS) ×2 IMPLANT
BRUSH SCRUB EZ PLAIN DRY (MISCELLANEOUS) ×1 IMPLANT
CHLORAPREP W/TINT 26 (MISCELLANEOUS) ×1 IMPLANT
DERMABOND ADVANCED .7 DNX12 (GAUZE/BANDAGES/DRESSINGS) ×1 IMPLANT
DRAPE C-ARM XRAY 36X54 (DRAPES) ×1 IMPLANT
DRAPE SHEET LG 3/4 BI-LAMINATE (DRAPES) ×2 IMPLANT
DRAPE TABLE BACK 80X90 (DRAPES) ×1 IMPLANT
DRSG MEPILEX SACRM 8.7X9.8 (GAUZE/BANDAGES/DRESSINGS) ×1 IMPLANT
DRSG OPSITE POSTOP 4X8 (GAUZE/BANDAGES/DRESSINGS) ×1 IMPLANT
ELECTRODE BLDE 4.0 EZ CLN MEGD (MISCELLANEOUS) ×1 IMPLANT
ELECTRODE REM PT RTRN 9FT ADLT (ELECTROSURGICAL) ×1 IMPLANT
GLOVE BIO SURGEON STRL SZ8 (GLOVE) ×1 IMPLANT
GLOVE BIOGEL PI IND STRL 8 (GLOVE) ×1 IMPLANT
GLOVE PI ORTHO PRO STRL 7.5 (GLOVE) ×2 IMPLANT
GLOVE PI ORTHO PRO STRL SZ8 (GLOVE) ×1 IMPLANT
GLOVE SURG SYN 7.5 PF PI (GLOVE) ×1 IMPLANT
GOWN SRG XL LONG LVL 3 NONREIN (GOWNS) ×1 IMPLANT
GOWN SRG XL LVL 3 NONREINFORCE (GOWNS) ×1 IMPLANT
GOWN STRL REUS W/ TWL LRG LVL3 (GOWN DISPOSABLE) ×1 IMPLANT
HEAD BIOLOX HIP 36/-2.5 (Joint) IMPLANT
HOOD PEEL AWAY T7 (MISCELLANEOUS) ×2 IMPLANT
INSERT 0 DEGREE 36 (Miscellaneous) IMPLANT
IV SODIUM CHLORIDE 100 ML PAB (IV SOLUTION) ×1 IMPLANT
KIT PATIENT CARE HANA TABLE (KITS) ×1 IMPLANT
KIT TURNOVER CYSTO (KITS) ×1 IMPLANT
LIGHT WAVEGUIDE WIDE FLAT (MISCELLANEOUS) ×1 IMPLANT
MANIFOLD NEPTUNE II (INSTRUMENTS) ×1 IMPLANT
MARKER SKIN DUAL TIP RULER LAB (MISCELLANEOUS) ×1 IMPLANT
MAT ABSORB FLUID 56X50 GRAY (MISCELLANEOUS) ×1 IMPLANT
NEEDLE SPNL 20GX3.5 QUINCKE YW (NEEDLE) ×1 IMPLANT
NS IRRIG 500ML POUR BTL (IV SOLUTION) ×1 IMPLANT
PACK HIP COMPR (MISCELLANEOUS) ×1 IMPLANT
PAD ARMBOARD POSITIONER FOAM (MISCELLANEOUS) ×2 IMPLANT
PENCIL SMOKE EVACUATOR (MISCELLANEOUS) ×1 IMPLANT
SCREW HEX LP 6.5X20 (Screw) IMPLANT
SHELL ACETAB TRIDENT 48 (Shell) IMPLANT
SLEEVE SCD COMPRESS KNEE MED (STOCKING) ×1 IMPLANT
SOLN STERILE WATER BTL 1000 ML (IV SOLUTION) ×1 IMPLANT
SOLUTION IRRIG SURGIPHOR (IV SOLUTION) ×1 IMPLANT
STEM STD OFFSET 0X93 (Stem) IMPLANT
SURGIFLO W/THROMBIN 8M KIT (HEMOSTASIS) IMPLANT
SUT BONE WAX W31G (SUTURE) ×1 IMPLANT
SUT ETHIBOND 2 V 37 (SUTURE) ×1 IMPLANT
SUT SILK 0 30XBRD TIE 6 (SUTURE) ×1 IMPLANT
SUT STRATAFIX 14 PDO 36 VLT (SUTURE) ×1 IMPLANT
SUT VIC AB 0 CT1 36 (SUTURE) ×1 IMPLANT
SUT VIC AB 2-0 CT2 27 (SUTURE) ×1 IMPLANT
SUTURE STRATA SPIR 4-0 18 (SUTURE) ×1 IMPLANT
SYR 20ML LL LF (SYRINGE) ×2 IMPLANT
TAPE MICROFOAM 4IN (TAPE) IMPLANT
TRAP FLUID SMOKE EVACUATOR (MISCELLANEOUS) ×1 IMPLANT
WAND WEREWOLF FASTSEAL 6.0 (MISCELLANEOUS) ×1 IMPLANT
insignia hip stem standard offset size 0 IMPLANT

## 2024-05-23 NOTE — Discharge Summary (Signed)
 Physician Discharge Summary  Patient ID: Rebecca Compton MRN: [id number] DOB/AGE: [birth year] 54 y.o.  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]   Discharge Diagnoses: Patient Active Problem List   Diagnosis Date Noted   S/P total right hip arthroplasty 05/23/2024   Counseling and coordination of care 09/09/2023   Atrial fibrillation (HCC) 07/15/2023   Endometrial cancer (HCC) 07/15/2023   Heart murmur 02/03/2013   Essential hypertension     Past Medical History:  Diagnosis Date   Anemia    Anxiety    Arthritis    Atrial fibrillation (HCC) 02/16/2019   new onset   Chronic ulcerative colitis (HCC)    Endometrial adenocarcinoma (HCC) 06/2023   Essential hypertension    GERD (gastroesophageal reflux disease)    Heart murmur    Morbid obesity with BMI of 50.0-59.9, adult (HCC)    Obesity, unspecified    Other abnormal glucose    Paroxysmal atrial fibrillation (HCC)    Pneumonia    Postmenopausal bleeding    Pre-diabetes    Right ovarian cyst      Transfusion: none   Consultants (if any):   Discharged Condition: Improved  Hospital Course: Rebecca Compton is an 54 y.o. female who was admitted 05/23/2024 with a diagnosis of S/P total right hip arthroplasty and went to the operating room on 05/23/2024 and underwent the above named procedures.    Surgeries: Procedures: ARTHROPLASTY, HIP, TOTAL, ANTERIOR APPROACH on 05/23/2024 Patient tolerated the surgery well. Taken to PACU where she was stabilized and then transferred to the orthopedic floor.  Started on Lovenox  40 mg q 24 hrs. TEDs and SCDs applied bilaterally. Heels elevated on bed. No evidence of DVT. Negative Homan. Physical therapy started on day #1 for gait training and transfer. OT started day #1 for ADL and assisted devices.  Patient's IV was d/c on day #1. Patient was able to safely and independently complete  all PT goals. PT recommending discharge to home.    On post op day #1 patient was stable and ready for discharge to home with HHPT.  Implants: Cup: Trident tritanium clusterhole 48/D w/x2 screws    Liner: Neutral X3 poly 36/D  Stem: Insignia #0 Std offset  Head:Biolox ceramic 36 -2.17mm    She was given perioperative antibiotics:  Anti-infectives (From admission, onward)    Start     Dose/Rate Route Frequency Ordered Stop   05/23/24 1330  ceFAZolin  (ANCEF ) IVPB 2g/100 mL premix        2 g 200 mL/hr over 30 Minutes Intravenous Every 6 hours 05/23/24 1134 05/23/24 2056   05/23/24 0600  ceFAZolin  (ANCEF ) IVPB 2g/100 mL premix        2 g 200 mL/hr over 30 Minutes Intravenous On call to O.R. 05/22/24 2316 05/23/24 0746     .  She was given sequential compression devices, early ambulation, and Lovenox  TEDs for DVT prophylaxis.  She benefited maximally from the hospital stay and there were no complications.    Recent vital signs:  Vitals:   05/24/24 0342 05/24/24 0707  BP: (!) 122/51 (!) 119/56  Pulse: 68 69  Resp: 16 16  Temp: 97.9 F (36.6 C) 98 F (36.7 C)  SpO2: 96% 97%    Recent laboratory studies:  Lab Results  Component Value Date   HGB 9.3 (L) 05/24/2024   HGB 11.4 (L) 05/17/2024   HGB 10.8 (L) 11/02/2023  Lab Results  Component Value Date   WBC 5.9 05/24/2024   PLT 262 05/24/2024   No results found for: INR Lab Results  Component Value Date   NA 138 05/24/2024   K 3.4 (L) 05/24/2024   CL 100 05/24/2024   CO2 28 05/24/2024   BUN 14 05/24/2024   CREATININE 0.89 05/24/2024   GLUCOSE 115 (H) 05/24/2024    Discharge Medications:   Allergies as of 05/24/2024       Reactions   Hydrochlorothiazide Other (See Comments)   Chest Pain        Medication List     TAKE these medications    Acetaminophen  Extra Strength 500 MG Tabs Take 2 tablets (1,000 mg total) by mouth every 8 (eight) hours.   celecoxib  100 MG capsule Commonly known as:  CeleBREX  Take 1 capsule (100 mg total) by mouth 2 (two) times daily for 7 days. What changed:  medication strength how much to take when to take this   chlorhexidine  4 % external liquid Commonly known as: HIBICLENS  Apply 15 mLs (1 Application total) topically as directed for 30 doses. Use as directed daily for 5 days every other week for 6 weeks.   chlorthalidone  25 MG tablet Commonly known as: HYGROTON  Take 25 mg by mouth daily.   diltiazem  240 MG 24 hr capsule Commonly known as: CARDIZEM  CD Take 240 mg by mouth daily.   diltiazem  30 MG tablet Commonly known as: Cardizem  Take 1 tablet (30 mg total) by mouth daily as needed (Take as needed for atrial fibrillation and heart rate above 110 that resists your long-acting medicine).   docusate sodium  100 MG capsule Commonly known as: COLACE Take 1 capsule (100 mg total) by mouth 2 (two) times daily.   enoxaparin  40 MG/0.4ML injection Commonly known as: LOVENOX  Inject 0.4 mLs (40 mg total) into the skin daily.   escitalopram  10 MG tablet Commonly known as: LEXAPRO  Take 10 mg by mouth daily.   lisinopril  40 MG tablet Commonly known as: ZESTRIL  Take 40 mg by mouth daily.   mupirocin  ointment 2 % Commonly known as: BACTROBAN  Place 1 Application into the nose 2 (two) times daily for 60 doses. Use as directed 2 times daily for 5 days every other week for 6 weeks.   ondansetron  4 MG tablet Commonly known as: ZOFRAN  Take 1 tablet (4 mg total) by mouth every 6 (six) hours as needed for nausea.   oxyCODONE  5 MG immediate release tablet Commonly known as: Roxicodone  Take 0.5-1 tablets (2.5-5 mg total) by mouth every 6 (six) hours as needed for breakthrough pain.   pantoprazole  40 MG tablet Commonly known as: PROTONIX  Take 40 mg by mouth daily.   traMADol  50 MG tablet Commonly known as: ULTRAM  Take 1 tablet (50 mg total) by mouth every 6 (six) hours as needed for moderate pain (pain score 4-6).   VITAMIN D3 PO Take 1,000  mcg by mouth daily.   Zepbound 5 MG/0.5ML Pen Generic drug: tirzepatide Inject 5 mg into the skin once a week. Sundays               Durable Medical Equipment  (From admission, onward)           Start     Ordered   05/23/24 1540  For home use only DME 3 n 1  Once        01 /05/26 1539   05/23/24 1540  For home use only DME Walker rolling  Once  Question Answer Comment  Walker: With 5 Inch Wheels   Patient needs a walker to treat with the following condition H/O total hip arthroplasty, right      05/23/24 1539            Diagnostic Studies: DG HIP UNILAT WITH PELVIS 2-3 VIEWS RIGHT Result Date: 05/23/2024 CLINICAL DATA:  Status post right hip surgery. EXAM: DG HIP (WITH OR WITHOUT PELVIS) 2-3V RIGHT COMPARISON:  Right hip MRI dated 12/22/2023 FINDINGS: Three C-arm images of the right hip demonstrate a right total hip prosthesis in satisfactory position and alignment. No fracture or dislocation seen. IMPRESSION: Satisfactory postoperative appearance of a right total hip prosthesis. Electronically Signed   By: Elspeth Bathe M.D.   On: 05/23/2024 18:15   DG C-Arm 1-60 Min-No Report Result Date: 05/23/2024 Fluoroscopy was utilized by the requesting physician.  No radiographic interpretation.   DG C-Arm 1-60 Min-No Report Result Date: 05/23/2024 Fluoroscopy was utilized by the requesting physician.  No radiographic interpretation.    Disposition:      Follow-up Information     Charlene Debby BROCKS, PA-C Follow up in 2 week(s).   Specialties: Orthopedic Surgery, Emergency Medicine Contact information: [address] [place] [address] [phone]                  Signed: Debby BROCKS Charlene 05/24/2024, 8:11 AM

## 2024-05-23 NOTE — Progress Notes (Signed)
 Patient is not able to walk the distance required to go the bathroom, or she is unable to safely negotiate stairs required to access the bathroom.  A 3in1 BSC will alleviate this problem.       Lollie Marrow, PA-C [facility]

## 2024-05-23 NOTE — Plan of Care (Signed)
" °  Problem: Education: Goal: Knowledge of the prescribed therapeutic regimen will improve Outcome: Progressing Goal: Understanding of discharge needs will improve Outcome: Progressing Goal: Individualized Educational Video(s) Outcome: Progressing   Problem: Activity: Goal: Ability to avoid complications of mobility impairment will improve Outcome: Progressing Goal: Ability to tolerate increased activity will improve Outcome: Progressing   Problem: Clinical Measurements: Goal: Postoperative complications will be avoided or minimized Outcome: Progressing   Problem: Pain Management: Goal: Pain level will decrease with appropriate interventions Outcome: Progressing   Problem: Skin Integrity: Goal: Will show signs of wound healing Outcome: Progressing   Problem: Education: Goal: Knowledge of General Education information will improve Description: Including pain rating scale, medication(s)/side effects and non-pharmacologic comfort measures Outcome: Progressing   Problem: Health Behavior/Discharge Planning: Goal: Ability to manage health-related needs will improve Outcome: Progressing   Problem: Clinical Measurements: Goal: Ability to maintain clinical measurements within normal limits will improve Outcome: Progressing Goal: Will remain free from infection Outcome: Progressing Goal: Diagnostic test results will improve Outcome: Progressing Goal: Respiratory complications will improve Outcome: Progressing Goal: Cardiovascular complication will be avoided Outcome: Progressing   Problem: Activity: Goal: Risk for activity intolerance will decrease Outcome: Progressing   Problem: Nutrition: Goal: Adequate nutrition will be maintained Outcome: Progressing   Problem: Coping: Goal: Level of anxiety will decrease Outcome: Progressing   Problem: Pain Managment: Goal: General experience of comfort will improve and/or be controlled Outcome: Progressing   Problem:  Safety: Goal: Ability to remain free from injury will improve Outcome: Progressing   Problem: Elimination: Goal: Will not experience complications related to bowel motility Outcome: Progressing Goal: Will not experience complications related to urinary retention Outcome: Progressing   Problem: Skin Integrity: Goal: Risk for impaired skin integrity will decrease Outcome: Progressing   "

## 2024-05-23 NOTE — Transfer of Care (Signed)
 Immediate Anesthesia Transfer of Care Note  Patient: Rebecca Compton  Procedure(s) Performed: ARTHROPLASTY, HIP, TOTAL, ANTERIOR APPROACH (Right: Hip)  Patient Location: PACU  Anesthesia Type:General and Spinal  Level of Consciousness: awake, alert , and oriented  Airway & Oxygen Therapy: Patient Spontanous Breathing  Post-op Assessment: Report given to RN and Post -op Vital signs reviewed and stable  Post vital signs: Reviewed and stable  Last Vitals:  Vitals Value Taken Time  BP 91/49 05/23/24 10:03  Temp    Pulse 56 05/23/24 10:05  Resp 15 05/23/24 10:05  SpO2 99 % 05/23/24 10:05  Vitals shown include unfiled device data.  Last Pain:  Vitals:   05/23/24 0622  TempSrc: Temporal  PainSc: 0-No pain         Complications: No notable events documented.

## 2024-05-23 NOTE — Op Note (Signed)
 Patient Name: Rebecca Compton  MRN: [id number]  Pre-Operative Diagnosis: Right hip Osteoarthritis  Post-Operative Diagnosis: (same)  Procedure: Right Total Hip Arthroplasty  Components/Implants: Cup: Trident tritanium clusterhole 48/D w/x2 screws    Liner: Neutral X3 poly 36/D  Stem: Insignia #0 Std offset  Head:Biolox ceramic 36 -2.5mm  Date of Surgery: 05/23/2024  Surgeon: Arthea Sheer MD  Assistant: Debby Amber PA (present and scrubbed throughout the case, critical for assistance with exposure, retraction, instrumentation, and closure)   Anesthesiologist: Vicci  Anesthesia: Spinal   EBL: 150cc  IVF:1100cc  Complications: None   Brief history: The patient is a 54 year old female with a history of osteoarthritis of the right hip with pain limiting their range of motion and activities of daily living, which has failed multiple attempts at conservative therapy.  The risks and benefits of total hip arthroplasty as definitive surgical treatment were discussed with the patient, who opted to proceed with the operation.  After outpatient medical clearance and optimization was completed the patient was admitted to [facility] for the procedure.  All preoperative films were reviewed and an appropriate surgical plan was made prior to surgery.   Description of procedure: The patient was brought to the operating room where laterality was confirmed by all those present to be the right side.  The patient was administered spinal anesthesia on a stretcher prior to being moved supine on the operating room table. Patient was given an intravenous dose of antibiotics for surgical prophylaxis and TXA.  All bony prominences and extremities were well padded and the patient was securely attached to the table boots, a perineal post was placed and the patient had a safety strap placed.  Surgical site was prepped with alcohol and chlorhexidine . The surgical site over the hip was and draped  in typical sterile fashion with multiple layers of adhesive and nonadhesive drapes.  The incision site was marked out with a sterile marker and care was taken to assess the position of the ASIS and ensure appropriate position for the incision.    A surgical timeout was then called with participation of all staff in the room the patient was then a confirmed again and laterality confirmed.  Incision was made over the anterior lateral aspect of the proximal thigh in line with the TFL.  Appropriate retractors were placed and all bleeding vessels were coagulated within the subcutaneous and fatty layers.  An incision was made in the TFL fascia in the interval was carefully identified.  The lateral ascending branches of the circumflex vessels were identified, cauterized and carefully dissected. The main vessels were then tied with a 0 silk hand tie.  Retractors were placed around the superior lateral and inferior medial aspects of the femoral neck and a capsulotomy was performed exposing the hip joint.  Retraction stitches were placed and the capsulotomy to assist with visualization.  The femoral head and anterior acetabulum were examined at this time and shown to have full thickness cartilage damage with severe arthritic changes, the decision was made to proceed with total hip arthroplasty. Femoral neck cut was then made and the femoral head was extracted after placing the leg in traction.  Bone wax was then applied to the proximal cut surface of the femur and water cooled bipolar electrocautery was used to address any bleeding around the femoral neck cut.  Retractors were then placed around the acetabulum to fully visualize the joint space, and the remaining labral tissue was removed and pulvinar was removed.  The acetabulum was then sequentially reamed up to the appropriate size in order to get good fit and fill for the acetabular component while under fluoroscopic guidance.  Acetabular component was then placed  and malleted into a secure fit while confirming position and abduction angle and anteversion utilizing fluoroscopy.  2 screws were then placed in the acetabular cup to assist in securing the cup in place. The cup was irrigated,  a real neutral liner was placed, impacted, and checked for stability. The femur traction was dropped and sequentially externally rotated while performing a release of the posterior and superomedial tissues off of the proximal femur to allow for mobility, care was taken to preserve the external rotators and piriformis attachments.  The remaining interval between the abductors and the capsule was dissected out and a retractor was placed over the superolateral aspect of the femur over the greater trochanter.  The leg was carefully brought down into extension and adducted to provide visualization of the proximal femur for broaching.  The femur was then sequentially broached up to an appropriate size which provided for good fill and stability to the femoral broach.  A trial neck and head were placed on the femoral broach and the leg was brought up for reduction.  The hip was reduced and manual check of stability was performed.  The hip was found to be stable in flexion internal rotation and extension external rotation.  Leg lengths were confirmed on fluoroscopy.   The hip was then dislocated the trial neck and head were removed.  The leg was then brought down into extension and adduction in the proximal femur was reexposed.  The broach trial was removed and the femur was irrigated with normal saline prior to the real femoral stem being implanted.  After the femoral stem was seated and shown to have good fit and fill the appropriate head was impacted the leg was brought up and reduced.  There was good range of motion with stability in flexion internal rotation and extension external rotation on testing.  Leg lengths were found to be appropriate on fluoroscopic evaluation at this time.  The hip  was then irrigated with betdine based surgiphor solution and then saline solution.  The capsulotomy was repaired with Ethibond sutures.  A pericapsular and peritrochanteric cocktail with Exparel  and bupivacaine  was then injected as well as the subcutaneous tissues. The fascia was closed with a #1 barbed running suture.  The deep tissues were closed with Vicryl sutures the subcutaneous tissues were closed with interrupted Vicryl sutures and a running barbed 4-0 suture.  The skin was then reinforced with Dermabond and a sterile dressing was placed.   The patient was awoken from anesthesia transferred off of the operating room table onto a hospital bed where examination of leg lengths found the leg lengths to be equal with a good distal pulse.  The patient was then transferred to the PACU in stable condition.

## 2024-05-23 NOTE — Discharge Instructions (Signed)
 Instructions after Anterior Total Hip Replacement        Dr. Zachary Aberman, Jr., M.D.      Dept. of Orthopaedics & Sports Medicine  [facility]  [address]  [place], [place]  [address]  Phone: [phone]   Fax: [phone]    DIET: Drink plenty of non-alcoholic fluids. Resume your normal diet. Include foods high in fiber.  ACTIVITY:  You may use crutches or a walker with weight-bearing as tolerated, unless instructed otherwise. You may be weaned off of the walker or crutches by your Physical Therapist.  Continue doing gentle exercises. Exercising will reduce the pain and swelling, increase motion, and prevent muscle weakness.   Please continue to use the TED compression stockings for 2 weeks. You may remove the stockings at night, but should reapply them in the morning. Do not drive or operate any equipment until instructed.  WOUND CARE:  Continue to use ice packs periodically to reduce pain and swelling. You may shower with honeycomb dressing 3 days after your surgery. Keep dressing on until your 2 week post op visit.   MEDICATIONS: You may resume your regular medications. Please take the pain medication as prescribed on the medication list. Do not take pain medication on an empty stomach. You have been given a prescription for a blood thinner to prevent blood clots. Please take the medication as instructed. (NOTE: After completing a 2 week course of Lovenox , take one Enteric-coated 81 mg aspirin  twice a day for 3 additional weeks.) Pain medications and iron supplements can cause constipation. Use a stool softener (Senokot or Colace) on a daily basis and a laxative (dulcolax or miralax ) as needed. Do not drive or drink alcoholic beverages when taking pain medications.  POSTOPERATIVE CONSTIPATION PROTOCOL Constipation - defined medically as fewer than three stools per week and severe constipation as less than one stool per week.  One of the most common issues  patients have following surgery is constipation.  Even if you have a regular bowel pattern at home, your normal regimen is likely to be disrupted due to multiple reasons following surgery.  Combination of anesthesia, postoperative narcotics, change in appetite and fluid intake all can affect your bowels.  In order to avoid complications following surgery, here are some recommendations in order to help you during your recovery period.  Colace (docusate) - Pick up an over-the-counter form of Colace or another stool softener and take twice a day as long as you are requiring postoperative pain medications.  Take with a full glass of water daily.  If you experience loose stools or diarrhea, hold the colace until you stool forms back up.  If your symptoms do not get better within 1 week or if they get worse, check with your doctor.  Dulcolax (bisacodyl) - Pick up over-the-counter and take as directed by the product packaging as needed to assist with the movement of your bowels.  Take with a full glass of water.  Use this product as needed if not relieved by Colace only.   MiraLax  (polyethylene glycol) - Pick up over-the-counter to have on hand.  MiraLax  is a solution that will increase the amount of water in your bowels to assist with bowel movements.  Take as directed and can mix with a glass of water, juice, soda, coffee, or tea.  Take if you go more than two days without a movement. Do not use MiraLax  more than once per day. Call your doctor if you are still constipated or irregular  after using this medication for 7 days in a row.  If you continue to have problems with postoperative constipation, please contact the office for further assistance and recommendations.  If you experience the worst abdominal pain ever or develop nausea or vomiting, please contact the office immediatly for further recommendations for treatment.   CALL THE OFFICE FOR: Temperature above 101 degrees Excessive bleeding or  drainage on the dressing. Excessive swelling, coldness, or paleness of the toes. Persistent nausea and vomiting.  FOLLOW-UP:  You should have an appointment to return to the office in 2 weeks after surgery. Arrangements have been made for continuation of Physical Therapy (either home therapy or outpatient therapy).

## 2024-05-23 NOTE — Progress Notes (Signed)
 Patient has mobility impairment for daily activities. A rolling walker will resolve this and the patient is safe to use it    T. Medford Amber, PA-C [facility]

## 2024-05-23 NOTE — Anesthesia Procedure Notes (Signed)
 Procedure Name: General with mask airway Date/Time: 05/23/2024 7:36 AM  Performed by: Janit Harman NOVAK, CRNA

## 2024-05-23 NOTE — Evaluation (Signed)
 Physical Therapy Evaluation Patient Details Name: Rebecca Compton MRN: [id number] DOB: [birth year] Today's Date: 05/23/2024  History of Present Illness  The patient is a 54 year old female with a history of osteoarthritis of the right hip with pain limiting their range of motion and activities of daily living s/p total right hip arthroplasty.  Clinical Impression  Patient noted to be in supine position at PT arrival in room, for an initial PT evaluation due to a decline in functional status, with baseline mobility reported as independent, and currently requiring CGA for all mobility. The patient is A&O x 4, presenting with good willingness to work with PT. The patient resides in a house and will live with children during recovery from surgery with family/friend support. There are 4 STE inside the residence. Gait was assessed with RW. Gait mechanic observations noted expected gait changes THA. The overall clinical impression is that the patient presents with mild mobility limitations. Recommended skilled PT will address safety, mobility, and discharge planning.        If plan is discharge home, recommend the following: A little help with walking and/or transfers;Help with stairs or ramp for entrance;A little help with bathing/dressing/bathroom;Assist for transportation   Can travel by private vehicle        Equipment Recommendations BSC/3in1;Rolling walker (2 wheels)  Recommendations for Other Services       Functional Status Assessment Patient has had a recent decline in their functional status and demonstrates the ability to make significant improvements in function in a reasonable and predictable amount of time.     Precautions / Restrictions Restrictions Weight Bearing Restrictions Per Provider Order: Yes      Mobility  Bed Mobility Overal bed mobility: Modified Independent                  Transfers Overall transfer level: Needs assistance Equipment used: Rolling walker (2  wheels) Transfers: Sit to/from Stand Sit to Stand: Contact guard assist                Ambulation/Gait Ambulation/Gait assistance: Contact guard assist, Supervision Gait Distance (Feet): 300 Feet   Gait Pattern/deviations: Step-through pattern, Decreased stance time - right, Decreased stride length Gait velocity: decreased     General Gait Details: good RW management  Stairs Stairs: Yes Stairs assistance: Contact guard assist Stair Management: Two rails, Forwards Number of Stairs: 8 General stair comments: vc for stepping sequence  Wheelchair Mobility     Tilt Bed    Modified Rankin (Stroke Patients Only)       Balance Overall balance assessment: Modified Independent                                           Pertinent Vitals/Pain Pain Assessment Pain Assessment: Faces Faces Pain Scale: Hurts a little bit Pain Location: R hip Pain Descriptors / Indicators: Aching Pain Intervention(s): Monitored during session    Home Living Family/patient expects to be discharged to:: Private residence Living Arrangements: Alone Available Help at Discharge: Family Type of Home: House Home Access: Stairs to enter Entrance Stairs-Rails: Can reach both Entrance Stairs-Number of Steps: 4   Home Layout: One level Home Equipment: None      Prior Function Prior Level of Function : Independent/Modified Independent                     Extremity/Trunk Assessment  Upper Extremity Assessment Upper Extremity Assessment: Overall WFL for tasks assessed    Lower Extremity Assessment Lower Extremity Assessment: Generalized weakness;RLE deficits/detail RLE Deficits / Details: R THA    Cervical / Trunk Assessment Cervical / Trunk Assessment: Normal  Communication   Communication Communication: No apparent difficulties    Cognition Arousal: Alert Behavior During Therapy: WFL for tasks assessed/performed   PT - Cognitive impairments: No  apparent impairments                         Following commands: Intact       Cueing Cueing Techniques: Verbal cues     General Comments      Exercises Total Joint Exercises Ankle Circles/Pumps: AROM, Both, 10 reps Quad Sets: AROM, Both, 10 reps Gluteal Sets: AROM, Both, 10 reps Short Arc Quad: AROM, Right, 5 reps Heel Slides: AROM, Right, 5 reps Hip ABduction/ADduction: AROM, Right, 5 reps Long Arc Quad: AROM, Right, 10 reps Knee Flexion: AROM, Right, 5 reps   Assessment/Plan    PT Assessment Patient needs continued PT services  PT Problem List Decreased strength;Decreased activity tolerance;Decreased mobility       PT Treatment Interventions Functional mobility training;Therapeutic activities;Stair training;Therapeutic exercise;Balance training;Neuromuscular re-education;Patient/family education    PT Goals (Current goals can be found in the Care Plan section)  Acute Rehab PT Goals Patient Stated Goal: Pt wants to go home PT Goal Formulation: With patient Time For Goal Achievement: 06/13/24 Potential to Achieve Goals: Good    Frequency Min 2X/week     Co-evaluation               AM-PAC PT 6 Clicks Mobility  Outcome Measure Help needed turning from your back to your side while in a flat bed without using bedrails?: None Help needed moving from lying on your back to sitting on the side of a flat bed without using bedrails?: None Help needed moving to and from a bed to a chair (including a wheelchair)?: None Help needed standing up from a chair using your arms (e.g., wheelchair or bedside chair)?: A Little Help needed to walk in hospital room?: A Little Help needed climbing 3-5 steps with a railing? : A Little 6 Click Score: 21    End of Session   Activity Tolerance: Patient tolerated treatment well Patient left: in bed;with call bell/phone within reach;with bed alarm set;with family/visitor present Nurse Communication: Mobility status PT  Visit Diagnosis: Unsteadiness on feet (R26.81);Difficulty in walking, not elsewhere classified (R26.2)    Time: 1349-1411 PT Time Calculation (min) (ACUTE ONLY): 22 min   Charges:   PT Evaluation $PT Eval Low Complexity: 1 Low   PT General Charges $$ ACUTE PT VISIT: 1 Visit         Sherlean Lesches DPT, PT    Denys Labree A Jemimah Cressy 05/23/2024, 2:25 PM

## 2024-05-23 NOTE — Anesthesia Procedure Notes (Addendum)
 Spinal  Start time: 05/23/2024 7:38 AM End time: 05/23/2024 7:42 AM  Staffing Performed: anesthesiologist  Authorized by: Vicci Camellia Glatter, MD   Performed by: Janit Harman NOVAK, CRNA Spinal Block Patient position: sitting Prep: ChloraPrep Patient monitoring: heart rate and continuous pulse ox Approach: midline Location: L3-4 Injection technique: single-shot Needle Needle type: Pencan  Needle gauge: 24 G Needle length: 10 cm Assessment Sensory level: T10 Events: CSF return  Additional Notes Placed via MDA

## 2024-05-23 NOTE — TOC Initial Note (Signed)
 Transition of Care [facility]) - Initial/Assessment Note    Patient Details  Name: Rebecca Compton MRN: [id number] Date of Birth: [birth year]  Transition of Care [facility]) CM/SW Contact:    Rebecca CHRISTELLA Ring, RN Phone Number: 05/23/2024, 3:01 PM  Clinical Narrative:                 CM met with patient and her daughter at the bedside.  Patient is from home she lives alone but her daughter and son will be staying with her.  She does not have any equipment already in the home, 3 in1 and RW ordered from [employer] to be delivered to the bedside.  Home health has already been arranged with [facility] and OT by Dr. Mal office.  Daughter will transport patient home tomorrow.   Expected Discharge Plan: Home w Home Health Services Barriers to Discharge: Continued Medical Work up   Patient Goals and CMS Choice            Expected Discharge Plan and Services   Discharge Planning Services: CM Consult   Living arrangements for the past 2 months: Single Family Home                 DME Arranged: Walker rolling, 3-N-1 DME Agency: [employer] Date DME Agency Contacted: 05/23/24 Time DME Agency Contacted: 1500 Representative spoke with at DME Agency: Rebecca Compton HH Arranged: PT, OT HH Agency: [facility] Date [facility] Agency Contacted: 05/23/24 Time HH Agency Contacted: 1500 Representative spoke with at [facility] Agency: Rebecca Compton   Prior Living Arrangements/Services Living arrangements for the past 2 months: Single Family Home Lives with:: Self Patient language and need for interpreter reviewed:: Yes Do you feel safe going back to the place where you live?: Yes      Need for Family Participation in Patient Care: Yes (Comment) Care giver support system in place?: Yes (comment)   Criminal Activity/Legal Involvement Pertinent to Current Situation/Hospitalization: No - Comment as needed  Activities of Daily Living   ADL Screening (condition at time of admission) Independently performs ADLs?: Yes (appropriate  for developmental age) Is the patient deaf or have difficulty hearing?: No Does the patient have difficulty seeing, even when wearing glasses/contacts?: No Does the patient have difficulty concentrating, remembering, or making decisions?: No  Permission Sought/Granted Permission sought to share information with : Family Supports Permission granted to share information with : Yes, Verbal Permission Granted  Share Information with NAME: Rebecca Compton  Permission granted to share info w AGENCY: [facility]  Permission granted to share info w Relationship: daughter  Permission granted to share info w Contact Information: [phone]  Emotional Assessment Appearance:: Appears stated age Attitude/Demeanor/Rapport: Engaged Affect (typically observed): Accepting Orientation: : Oriented to Self, Oriented to Place, Oriented to  Time, Oriented to Situation Alcohol / Substance Use: Not Applicable Psych Involvement: No (comment)  Admission diagnosis:  Primary osteoarthritis of right hip [M16.11] S/P total right hip arthroplasty [S03.358] Patient Active Problem List   Diagnosis Date Noted   S/P total right hip arthroplasty 05/23/2024   Counseling and coordination of care 09/09/2023   Atrial fibrillation (HCC) 07/15/2023   Endometrial cancer (HCC) 07/15/2023   Heart murmur 02/03/2013   Essential hypertension    PCP:  [facility] Pharmacy:   [facility] - [place], [place] - [address] [place] [address] Phone: [phone] Fax: [phone]  [facility] [address] [place] [address] Phone: [phone] Fax: [phone]  Social Drivers of Health (SDOH) Social History: SDOH Screenings   Food Insecurity: No Food Insecurity (05/23/2024)  Housing: Low Risk (05/23/2024)  Transportation Needs: No Transportation Needs (05/23/2024)  Utilities: Not At Risk (05/23/2024)  Depression (PHQ2-9):  Low Risk (04/27/2024)  Financial Resource Strain: Low Risk  (04/29/2024)   Received from [facility]  Tobacco Use: Low Risk (05/23/2024)   SDOH Interventions:     Readmission Risk Interventions     No data to display

## 2024-05-23 NOTE — Interval H&P Note (Signed)
 Patient history and physical updated. Consent reviewed including risks, benefits, and alternatives to surgery. Patient agrees with above plan to proceed with right anterior total hip arthroplasty.

## 2024-05-23 NOTE — H&P (Signed)
 History of Present Illness: Rebecca Compton is an 54 y.o. female presents for history and physical for right total hip arthroplasty with Dr. Lorelle on 05/23/2024. Patient has severe right hip osteoarthritis with effusion seen on previous MRI. She has had greater than 1 year of right hip pain that is significantly interfered with her quality of life and activities day living. She has 10 out of 10 pain located in her groin that is worse with standing and weightbearing activity. She had an injection back in June with 2 days of excellent relief but then pain returned. She has tried Celebrex  with little relief. She denies any history of blood clots, fevers, chills.  Patient is a non-smoker, nondiabetic with a BMI of 45  Past Medical History: Past Medical History:  Diagnosis Date  Endometrial cancer (CMS/HHS-HCC) 07/29/2023  Hypertension  Murmur, cardiac  Ulcerative colitis, chronic (CMS/HHS-HCC)   Past Surgical History: Past Surgical History:  Procedure Laterality Date  SIGMOIDOSCOPY 09/18/1999  COLONOSCOPY 09/23/2012  Ulcerative Colitis, FH Colon Polyps (Mother): [facility] 09/2017; Recall Ltr mailed 08/10/2017 (dh)  HYSTERECTOMY 07/29/2023  DILATION AND CURETTAGE, DIAGNOSTIC / THERAPEUTIC  LAPAROSCOPIC TUBAL LIGATION   Past Family History: Family History  Problem Relation Age of Onset  Colon polyps Mother  Diabetes type II Mother  Colon cancer Maternal Grandfather  Rectal cancer Maternal Grandfather  Breast cancer Other  Hyperlipidemia (Elevated cholesterol) Father   Medications: Current Outpatient Medications  Medication Sig Dispense Refill  chlorthalidone  25 MG tablet Take 1 tablet (25 mg total) by mouth once daily 30 tablet 5  dilTIAZem  (CARDIZEM  CD) 240 MG CD capsule Take 1 capsule (240 mg total) by mouth once daily 90 capsule 3  escitalopram  oxalate (LEXAPRO ) 10 MG tablet Take 1 tablet (10 mg total) by mouth once daily 90 tablet 1  lisinopriL  (ZESTRIL ) 20 MG tablet Take 1 tablet (20 mg  total) by mouth once daily 90 tablet 3  pantoprazole  (PROTONIX ) 40 MG DR tablet Take 1 tablet (40 mg total) by mouth once daily 30 tablet 5  tirzepatide (ZEPBOUND) 5 mg/0.5 mL injection Inject 0.5 mLs (5 mg total) subcutaneously every 7 (seven) days 2 mL 2   No current facility-administered medications for this visit.   Allergies: Allergies  Allergen Reactions  Hydrochlorothiazide Other (See Comments)  Weird feeling in chest    Visit Vitals: Vitals:  04/29/24 0925  BP: (!) 142/88    Review of Systems:  A comprehensive 14 point ROS was performed, reviewed, and the pertinent orthopaedic findings are documented in the HPI.  Physical Exam: Body mass index is 45.49 kg/m. General:  Well developed, well nourished, no apparent distress, normal affect, antalgic gait  HEENT: Head normocephalic, atraumatic, PERRL.   Abdomen: Soft, non tender, non distended, Bowel sounds present.  Heart: Examination of the heart reveals regular, rate, and rhythm. There is no murmur noted on ascultation. There is a normal apical pulse.  Lungs: Lungs are clear to auscultation. There is no wheeze, rhonchi, or crackles. There is normal expansion of bilateral chest walls.   Right hip exam  SKIN: intact SWELLING: none WARMTH: no warmth TENDERNESS: none, Stinchfield Positive ROM: 0 degrees internal rotation and 30 degrees external rotation and pain with internal rotation,; Hip Flexion 80 STRENGTH: limited by pain GAIT: antalgic and stiff-legged STABILITY: stable to testing CREPITUS: yes LEG LENGTH DISCREPANCY: none NEUROLOGICAL EXAM: normal VASCULAR EXAM: normal LUMBAR SPINE: tenderness: no straight leg raising sign: no motor exam: normal  The contralateral hip was examined for comparison and it showed: TENDERNESS:  none ROM: normal and full STRENGTH: normal STABILITY: stable to testing  Hip Imaging :  Right hip x-rays show severe degenerative changes of the right hip with joint space  narrowing and superior bone-on-bone articulation with sclerosis and cystic formation of the femoral head. The left hip shows mild degenerative changes with medial and superior joint space narrowing and sclerosis. No fractures occasions in about either hip or the pelvis.   Narrative  This result has an attachment that is not available. CLINICAL DATA: Right hip and groin pain  EXAM: MR OF THE RIGHT HIP WITHOUT CONTRAST  TECHNIQUE: Multiplanar, multisequence MR imaging was performed. No intravenous contrast was administered.  COMPARISON: None Available.  FINDINGS: Bones:  No hip fracture, dislocation or avascular necrosis.  No periosteal reaction or bone destruction. No aggressive osseous lesion.  Normal sacrum and sacroiliac joints. No SI joint widening or erosive changes.  Articular cartilage and labrum  Articular cartilage: Extensive full-thickness cartilage loss of the right femoral head and acetabulum with subchondral marrow edema and cystic changes. High-grade partial-thickness cartilage loss with areas of full-thickness cartilage loss of the left femoral head and acetabulum with subchondral cyst in the superior acetabulum.  Labrum: Degenerative tearing of the right anterosuperior labrum.  Joint or bursal effusion  Joint effusion: Large right hip joint effusion with synovitis. Small left hip joint effusion. No SI joint effusion.  Bursae: No bursal fluid.  Muscles and tendons  Flexors: Normal.  Extensors: Normal.  Abductors: Normal.  Adductors: Normal.  Gluteals: Partial-thickness tear of the gluteus minimus tendon insertion bilaterally.  Hamstrings: Normal.  Other findings  No pelvic free fluid. No fluid collection or hematoma. No inguinal lymphadenopathy. No inguinal hernia.  IMPRESSION: 1. Severe osteoarthritis of the right hip. Large right hip joint effusion with synovitis. 2. Moderate-severe osteoarthritis of the left hip. 3.  Partial-thickness tear of the gluteus minimus tendon insertion bilaterally.  Electronically Signed By: Julaine Blanch M.D. On: 12/29/2023 10:48  Assessment:  Right hip osteoarthritis  Plan: Rebecca Compton is a 54 year old female presents for history and physical for right total hip arthroplasty anterior approach on 05/23/2024. She has severe right hip osteoarthritis with severe debilitating hip pain that is interfering with her quality of life and activities day living. She has had very little relief with conservative treatment. Her pain has been increasing as well as having a decline in mobility. Risks, benefits, complications of right anterior total hip arthroplasty have been discussed with the patient.   The hospitalization and post-operative care and rehabilitation were also discussed. The use of perioperative antibiotics and DVT prophylaxis were discussed. The risk, benefits and alternatives to a surgical intervention were discussed at length with the patient. The patient was also advised of risks related to the medical comorbidities and elevated body mass index (BMI). A lengthy discussion took place to review the most common complications including but not limited to: deep vein thrombosis, pulmonary embolus, heart attack, stroke, infection, wound breakdown, heterotopic ossification, dislocation, numbness, leg length in-equality, intraoperative fracture, damage to nerves, tendon,muscles, arteries or other blood vessels, death and other possible complications from anesthesia. The patient was told that we will take steps to minimize these risks by using sterile technique, antibiotics and DVT prophylaxis when appropriate and follow the patient postoperatively in the office setting to monitor progress. The possibility of recurrent pain, no improvement in pain and actual worsening of pain were also discussed with the patient. The risk of dislocation following total hip replacement was discussed and potential  precautions to prevent dislocation  were reviewed.   All questions answered, patient agrees with above plan for right anterior total hip arthroplasty.

## 2024-05-24 ENCOUNTER — Encounter: Payer: Self-pay | Admitting: Orthopedic Surgery

## 2024-05-24 DIAGNOSIS — M1611 Unilateral primary osteoarthritis, right hip: Secondary | ICD-10-CM | POA: Diagnosis not present

## 2024-05-24 LAB — CBC
HCT: 28.6 % — ABNORMAL LOW (ref 36.0–46.0)
Hemoglobin: 9.3 g/dL — ABNORMAL LOW (ref 12.0–15.0)
MCH: 25.4 pg — ABNORMAL LOW (ref 26.0–34.0)
MCHC: 32.5 g/dL (ref 30.0–36.0)
MCV: 78.1 fL — ABNORMAL LOW (ref 80.0–100.0)
Platelets: 262 K/uL (ref 150–400)
RBC: 3.66 MIL/uL — ABNORMAL LOW (ref 3.87–5.11)
RDW: 16.2 % — ABNORMAL HIGH (ref 11.5–15.5)
WBC: 5.9 K/uL (ref 4.0–10.5)
nRBC: 0 % (ref 0.0–0.2)

## 2024-05-24 LAB — BASIC METABOLIC PANEL WITH GFR
Anion gap: 9 (ref 5–15)
BUN: 14 mg/dL (ref 6–20)
CO2: 28 mmol/L (ref 22–32)
Calcium: 9.1 mg/dL (ref 8.9–10.3)
Chloride: 100 mmol/L (ref 98–111)
Creatinine, Ser: 0.89 mg/dL (ref 0.44–1.00)
GFR, Estimated: >60 mL/min
Glucose, Bld: 115 mg/dL — ABNORMAL HIGH (ref 70–99)
Potassium: 3.4 mmol/L — ABNORMAL LOW (ref 3.5–5.1)
Sodium: 138 mmol/L (ref 135–145)

## 2024-05-24 MED ORDER — LISINOPRIL 20 MG PO TABS
ORAL_TABLET | ORAL | Status: AC
  Filled 2024-05-24: qty 2

## 2024-05-24 NOTE — Plan of Care (Signed)
  Problem: Clinical Measurements: Goal: Postoperative complications will be avoided or minimized Outcome: Progressing   

## 2024-05-24 NOTE — TOC Transition Note (Signed)
 Transition of Care [facility]) - Discharge Note   Patient Details  Name: Rebecca Compton MRN: [id number] Date of Birth: [birth year]  Transition of Care [facility]) CM/SW Contact:  Victory Jackquline RAMAN, RN Phone Number: 05/24/2024, 9:37 AM   Clinical Narrative:    Patient discharging home with [facility] for PT/OT that was arranged by Dr. Mal office. DME (RW & BSC) delivered to bedside on Monday, 05/24/2023. Patient's daughter will be picking her up at the time of discharge. No further concerns. RNCM signing off.    Final next level of care: Home w Home Health Services Barriers to Discharge: Barriers Resolved   Patient Goals and CMS Choice            Discharge Placement                Patient to be transferred to facility by: Daughter Name of family member notified: [facility] Patient and family notified of of transfer: 05/24/24  Discharge Plan and Services Additional resources added to the After Visit Summary for     Discharge Planning Services: CM Consult            DME Arranged: 3-N-1, Walker rolling DME Agency: [employer] Date DME Agency Contacted: 05/23/24 Time DME Agency Contacted: 1500 Representative spoke with at DME Agency: Mitch HH Arranged: PT, OT HH Agency: [facility] Date [facility] Agency Contacted: 05/23/24 Time HH Agency Contacted: 1500 Representative spoke with at [facility] Agency: HH/PT/OT set up by Dr. Mal office  Social Drivers of Health (SDOH) Interventions SDOH Screenings   Food Insecurity: No Food Insecurity (05/23/2024)  Housing: Low Risk (05/23/2024)  Transportation Needs: No Transportation Needs (05/23/2024)  Utilities: Not At Risk (05/23/2024)  Depression (PHQ2-9): Low Risk (04/27/2024)  Financial Resource Strain: Low Risk  (04/29/2024)   Received [facility]  Tobacco Use: Low Risk (05/23/2024)     Readmission Risk Interventions     No data to display

## 2024-05-24 NOTE — Progress Notes (Signed)
 DISCHARGE NOTE:   Pt dc with IV removed and dc instructions given. Pt received 3 in 1 and RW delivered to hospital room. Pt also received medication delivered to hospital room. Pt educated on TED hose and give a ice pack for at home use. Pt voices no questions or concerns at this time. Pt wheeled down to medical mall entrance by staff. Pt's daughter provided transportation.

## 2024-05-24 NOTE — Anesthesia Postprocedure Evaluation (Signed)
"   Anesthesia Post Note  Patient: Mardel J Schoff  Procedure(s) Performed: ARTHROPLASTY, HIP, TOTAL, ANTERIOR APPROACH (Right: Hip)  Patient location during evaluation: Nursing Unit Anesthesia Type: Combined General/Spinal Level of consciousness: oriented and awake and alert Pain management: pain level controlled Vital Signs Assessment: post-procedure vital signs reviewed and stable Respiratory status: spontaneous breathing and respiratory function stable Cardiovascular status: blood pressure returned to baseline and stable Postop Assessment: no headache, no backache, no apparent nausea or vomiting and patient able to bend at knees Anesthetic complications: no   No notable events documented.   Last Vitals:  Vitals:   05/24/24 0342 05/24/24 0707  BP: (!) 122/51 (!) 119/56  Pulse: 68 69  Resp: 16 16  Temp: 36.6 C 36.7 C  SpO2: 96% 97%    Last Pain:  Vitals:   05/24/24 0748  TempSrc:   PainSc: 0-No pain                 Royer Cristobal M. Adi Doro      "

## 2024-05-24 NOTE — Progress Notes (Signed)
" ° °  Subjective: 1 Day Post-Op Procedures (LRB): ARTHROPLASTY, HIP, TOTAL, ANTERIOR APPROACH (Right) Patient reports pain as mild.   Patient is well, and has had no acute complaints or problems Denies any CP, SOB, ABD pain. We will continue therapy today.  Plan is to go Home after hospital stay.  Objective: Vital signs in last 24 hours: Temp:  [97 F (36.1 C)-98.2 F (36.8 C)] 98 F (36.7 C) (01/06 0707) Pulse Rate:  [54-69] 69 (01/06 0707) Resp:  [14-19] 16 (01/06 0707) BP: (76-125)/(39-57) 119/56 (01/06 0707) SpO2:  [93 %-100 %] 97 % (01/06 0707)  Intake/Output from previous day: 01/05 0701 - 01/06 0700 In: 625.9 [I.V.:625.9] Out: 150 [Blood:150] Intake/Output this shift: No intake/output data recorded.  Recent Labs    05/24/24 0614  HGB 9.3*   Recent Labs    05/24/24 0614  WBC 5.9  RBC 3.66*  HCT 28.6*  PLT 262   Recent Labs    05/24/24 0614  NA 138  K 3.4*  CL 100  CO2 28  BUN 14  CREATININE 0.89  GLUCOSE 115*  CALCIUM 9.1   No results for input(s): LABPT, INR in the last 72 hours.  EXAM General - Patient is Alert, Appropriate, and Oriented Extremity - Neurovascular intact Sensation intact distally Intact pulses distally Dorsiflexion/Plantar flexion intact Dressing - dressing C/D/I and no drainage Motor Function - intact, moving foot and toes well on exam.   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   Weight as of this encounter: 117 kg. Advance diet Up with therapy Pain well-controlled Labs and vital signs are stable Care management to assist with discharge to home with home health PT today  DVT Prophylaxis - Lovenox , TED hose, and SCDs Weight-Bearing as tolerated to right leg   T. Medford Amber, PA-C [facility] 05/24/2024, 8:11 AM   "

## 2024-05-24 NOTE — Progress Notes (Signed)
 Physical Therapy Treatment Patient Details Name: Rebecca Compton MRN: [id number] DOB: [birth year] Today's Date: 05/24/2024   History of Present Illness The patient is a 54 year old female with a history of osteoarthritis of the right hip with pain limiting their range of motion and activities of daily living s/p total right hip arthroplasty.    PT Comments  Patient progressing well towards physical therapy goals. Completed bed mobility and sit to stand with RW modI. Ambulated 300' with RW modI. Negotiated stairs with B handrails with good step to pattern. No questions from patient. Able to dress self modI in standing. Discharge plan remains appropriate.     If plan is discharge home, recommend the following: A little help with walking and/or transfers;Help with stairs or ramp for entrance;A little help with bathing/dressing/bathroom;Assist for transportation   Can travel by private vehicle        Equipment Recommendations  BSC/3in1;Rolling Veta Dambrosia (2 wheels)    Recommendations for Other Services       Precautions / Restrictions Precautions Precautions: Fall Recall of Precautions/Restrictions: Intact Restrictions Weight Bearing Restrictions Per Provider Order: Yes RLE Weight Bearing Per Provider Order: Weight bearing as tolerated     Mobility  Bed Mobility Overal bed mobility: Modified Independent                  Transfers Overall transfer level: Modified independent Equipment used: Rolling Morrissa Shein (2 wheels)                    Ambulation/Gait Ambulation/Gait assistance: Modified independent (Device/Increase time) Gait Distance (Feet): 300 Feet Assistive device: Rolling Narely Nobles (2 wheels) Gait Pattern/deviations: Step-through pattern, Decreased stance time - right, Decreased stride length   Gait velocity interpretation: >2.62 ft/sec, indicative of community ambulatory       Stairs Stairs: Yes Stairs assistance: Modified independent (Device/Increase  time) Stair Management: Two rails, Forwards Number of Stairs: 4     Wheelchair Mobility     Tilt Bed    Modified Rankin (Stroke Patients Only)       Balance Overall balance assessment: Modified Independent                                          Communication Communication Communication: No apparent difficulties  Cognition Arousal: Alert Behavior During Therapy: WFL for tasks assessed/performed   PT - Cognitive impairments: No apparent impairments                         Following commands: Intact      Cueing Cueing Techniques: Verbal cues  Exercises      General Comments        Pertinent Vitals/Pain Pain Assessment Pain Assessment: Faces Faces Pain Scale: Hurts a little bit Pain Location: R hip Pain Descriptors / Indicators: Aching Pain Intervention(s): Limited activity within patient's tolerance, Monitored during session    Home Living                          Prior Function            PT Goals (current goals can now be found in the care plan section) Acute Rehab PT Goals Patient Stated Goal: Pt wants to go home PT Goal Formulation: With patient Time For Goal Achievement: 06/13/24 Potential to Achieve Goals: Good Progress towards PT goals: Progressing  toward goals    Frequency    BID      PT Plan      Co-evaluation              AM-PAC PT 6 Clicks Mobility   Outcome Measure  Help needed turning from your back to your side while in a flat bed without using bedrails?: None Help needed moving from lying on your back to sitting on the side of a flat bed without using bedrails?: None Help needed moving to and from a bed to a chair (including a wheelchair)?: None Help needed standing up from a chair using your arms (e.g., wheelchair or bedside chair)?: None Help needed to walk in hospital room?: None Help needed climbing 3-5 steps with a railing? : A Little 6 Click Score: 23    End of  Session   Activity Tolerance: Patient tolerated treatment well Patient left: in bed;with call bell/phone within reach Nurse Communication: Mobility status PT Visit Diagnosis: Unsteadiness on feet (R26.81);Difficulty in walking, not elsewhere classified (R26.2)     Time: 0912-0924 PT Time Calculation (min) (ACUTE ONLY): 12 min  Charges:    $Therapeutic Activity: 8-22 mins PT General Charges $$ ACUTE PT VISIT: 1 Visit                     Maryanne Finder, PT, DPT Physical Therapist - [facility]  [facility]    Dameer Speiser A Cameka Rae 05/24/2024, 9:27 AM

## 2024-05-30 ENCOUNTER — Other Ambulatory Visit: Payer: Self-pay

## 2024-05-31 ENCOUNTER — Encounter: Payer: Self-pay | Admitting: Orthopedic Surgery

## 2024-05-31 ENCOUNTER — Other Ambulatory Visit: Payer: Self-pay

## 2024-06-29 ENCOUNTER — Ambulatory Visit: Admitting: Radiation Oncology

## 2024-10-26 ENCOUNTER — Inpatient Hospital Stay
